# Patient Record
Sex: Female | Born: 1975 | Race: Black or African American | Hispanic: No | Marital: Single | Smoking: Never smoker
Health system: Southern US, Community
[De-identification: ages and names within clinical notes are randomized; demographics above are authoritative.]

## PROBLEM LIST (undated history)

## (undated) DIAGNOSIS — E669 Obesity, unspecified: Secondary | ICD-10-CM

## (undated) DIAGNOSIS — F329 Major depressive disorder, single episode, unspecified: Secondary | ICD-10-CM

## (undated) DIAGNOSIS — N200 Calculus of kidney: Secondary | ICD-10-CM

## (undated) DIAGNOSIS — F32A Depression, unspecified: Secondary | ICD-10-CM

## (undated) DIAGNOSIS — H409 Unspecified glaucoma: Secondary | ICD-10-CM

## (undated) DIAGNOSIS — Z87442 Personal history of urinary calculi: Secondary | ICD-10-CM

## (undated) DIAGNOSIS — R51 Headache: Secondary | ICD-10-CM

## (undated) DIAGNOSIS — R519 Headache, unspecified: Secondary | ICD-10-CM

## (undated) DIAGNOSIS — R1900 Intra-abdominal and pelvic swelling, mass and lump, unspecified site: Secondary | ICD-10-CM

## (undated) DIAGNOSIS — F419 Anxiety disorder, unspecified: Secondary | ICD-10-CM

## (undated) DIAGNOSIS — K30 Functional dyspepsia: Secondary | ICD-10-CM

## (undated) DIAGNOSIS — K8020 Calculus of gallbladder without cholecystitis without obstruction: Secondary | ICD-10-CM

## (undated) HISTORY — PX: FOOT SURGERY: SHX648

## (undated) HISTORY — DX: Depression, unspecified: F32.A

## (undated) HISTORY — DX: Major depressive disorder, single episode, unspecified: F32.9

## (undated) HISTORY — DX: Unspecified glaucoma: H40.9

## (undated) HISTORY — PX: GALLBLADDER SURGERY: SHX652

## (undated) HISTORY — DX: Intra-abdominal and pelvic swelling, mass and lump, unspecified site: R19.00

## (undated) HISTORY — DX: Obesity, unspecified: E66.9

## (undated) HISTORY — PX: WISDOM TOOTH EXTRACTION: SHX21

## (undated) HISTORY — PX: OTHER SURGICAL HISTORY: SHX169

## (undated) HISTORY — PX: TOOTH EXTRACTION: SUR596

## (undated) HISTORY — DX: Anxiety disorder, unspecified: F41.9

---
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
## 2001-02-12 ENCOUNTER — Other Ambulatory Visit: Admission: RE | Admit: 2001-02-12 | Discharge: 2001-02-12 | Payer: Self-pay | Admitting: Obstetrics and Gynecology

## 2001-03-17 ENCOUNTER — Encounter: Admission: RE | Admit: 2001-03-17 | Discharge: 2001-06-15 | Payer: Self-pay | Admitting: Obstetrics and Gynecology

## 2001-06-04 ENCOUNTER — Inpatient Hospital Stay (HOSPITAL_COMMUNITY): Admission: AD | Admit: 2001-06-04 | Discharge: 2001-06-04 | Payer: Self-pay | Admitting: Obstetrics and Gynecology

## 2001-08-27 ENCOUNTER — Inpatient Hospital Stay (HOSPITAL_COMMUNITY): Admission: AD | Admit: 2001-08-27 | Discharge: 2001-08-30 | Payer: Self-pay | Admitting: Ophthalmology

## 2001-09-28 ENCOUNTER — Other Ambulatory Visit: Admission: RE | Admit: 2001-09-28 | Discharge: 2001-09-28 | Payer: Self-pay | Admitting: Obstetrics and Gynecology

## 2002-11-03 ENCOUNTER — Other Ambulatory Visit: Admission: RE | Admit: 2002-11-03 | Discharge: 2002-11-03 | Payer: Self-pay | Admitting: Obstetrics & Gynecology

## 2003-02-27 ENCOUNTER — Encounter: Payer: Self-pay | Admitting: Obstetrics & Gynecology

## 2003-02-27 ENCOUNTER — Encounter: Admission: RE | Admit: 2003-02-27 | Discharge: 2003-02-27 | Payer: Self-pay | Admitting: Obstetrics & Gynecology

## 2003-11-10 ENCOUNTER — Other Ambulatory Visit: Admission: RE | Admit: 2003-11-10 | Discharge: 2003-11-10 | Payer: Self-pay | Admitting: Obstetrics & Gynecology

## 2004-06-07 ENCOUNTER — Encounter: Admission: RE | Admit: 2004-06-07 | Discharge: 2004-09-05 | Payer: Self-pay | Admitting: Family Medicine

## 2006-09-10 ENCOUNTER — Inpatient Hospital Stay (HOSPITAL_COMMUNITY): Admission: AD | Admit: 2006-09-10 | Discharge: 2006-09-14 | Payer: Self-pay | Admitting: Obstetrics and Gynecology

## 2007-04-21 ENCOUNTER — Ambulatory Visit: Payer: Self-pay | Admitting: Family Medicine

## 2007-04-21 DIAGNOSIS — R142 Eructation: Secondary | ICD-10-CM

## 2007-04-21 DIAGNOSIS — R143 Flatulence: Secondary | ICD-10-CM

## 2007-04-21 DIAGNOSIS — K3189 Other diseases of stomach and duodenum: Secondary | ICD-10-CM

## 2007-04-21 DIAGNOSIS — R141 Gas pain: Secondary | ICD-10-CM

## 2007-04-21 DIAGNOSIS — R1013 Epigastric pain: Secondary | ICD-10-CM

## 2007-04-22 ENCOUNTER — Telehealth (INDEPENDENT_AMBULATORY_CARE_PROVIDER_SITE_OTHER): Payer: Self-pay | Admitting: *Deleted

## 2007-04-22 LAB — CONVERTED CEMR LAB
ALT: 15 units/L (ref 0–35)
AST: 16 units/L (ref 0–37)
Albumin: 3.7 g/dL (ref 3.5–5.2)
Alkaline Phosphatase: 96 units/L (ref 39–117)
Bilirubin, Direct: 0.1 mg/dL (ref 0.0–0.3)
H Pylori IgG: NEGATIVE
Total Bilirubin: 0.7 mg/dL (ref 0.3–1.2)
Total Protein: 7.3 g/dL (ref 6.0–8.3)

## 2007-05-19 ENCOUNTER — Ambulatory Visit: Payer: Self-pay | Admitting: Family Medicine

## 2007-06-10 ENCOUNTER — Emergency Department (HOSPITAL_COMMUNITY): Admission: EM | Admit: 2007-06-10 | Discharge: 2007-06-10 | Payer: Self-pay | Admitting: Family Medicine

## 2010-11-15 NOTE — Discharge Summary (Signed)
NAME:  [name], [name]        ACCOUNT NO.:  [id number]   MEDICAL RECORD NO.:  [id number]          PATIENT TYPE:  INP   LOCATION:  9115                          FACILITY:  [facility]   PHYSICIAN:  Dineen Kid. Rana Snare, M.D.    DATE OF BIRTH:  09/29/1975   DATE OF ADMISSION:  09/10/2006  DATE OF DISCHARGE:  09/14/2006                               DISCHARGE SUMMARY   ADMITTING DIAGNOSES:  1. Intrauterine pregnancy at term.  2. Induction of labor secondary to decreased amniotic fluid.   DISCHARGE DIAGNOSES:  1. Status post low transverse cesarean section secondary to      nonreassuring fetal heart tones.  2. Viable female infant.   PROCEDURES:  Low transverse cesarean section.   REASON FOR ADMISSION:  Please see written H&P.   HOSPITAL COURSE:  The patient is a 35 year old African American married  female gravida 2, para 1 that was admitted to [facility] for an induction of labor.  The patient had been seen in the  office on the day prior to admission with noted decreased amniotic  fluid.  Estimated fetal weight was 8.5 pounds.  Blood pressure was noted  to be normal.  The patient was administered Cytotec.  The patient did  experience some mild to moderate contractions, and baby was noted to  have some repetitive decelerations.  The patient was administered oxygen  and terbutaline, and Cytotec was discontinued.  On the following morning  in view of the tracing which was noted to be at the time of rounding  reactive, given the history and decelerations in the low amniotic fluid,  decision was made to proceed with a primary low transverse cesarean  section.  The patient was then transferred to the operating room where  spinal anesthesia was administered without difficulty.  A low transverse  incision was made with delivery of a viable female infant weighing 8  pounds 14 ounces, Apgars of 8 at one minute and 9 at five minutes.  Arterial cord pH was 7.31.  The patient  tolerated the procedure well and  was taken to the recovery room in stable condition.  On postoperative  day #1, the patient was without complaint.  Vital signs were stable.  Abdomen soft with good return of bowel function.  Abdominal dressing was  noted to be clean, dry and intact.  Laboratory findings revealed a  hemoglobin of 10.4, platelet count of 202,000, WBC count of 12.0.  On  postoperative day #2, the patient was without complaint.  Vital signs  remained stable.  She is afebrile.  The fundus firm and nontender.  Abdominal dressing had been removed revealing an incision that was  clean, dry and intact.  She was ambulating well.  On postoperative day  #3, the patient was without complaint.  Vital signs remained stable.  Fundus firm and nontender.  Incision was clean, dry and intact.  Staples  were removed, and the patient was later discharged home.   CONDITION ON DISCHARGE:  Good.   DIET:  Regular as tolerated.   ACTIVITY:  No heavy lifting, no driving x2 weeks, no vaginal entry.   FOLLOW  UP:  Patient to follow up in the office in 1-2 weeks for incision  check.  She is to call for temperature greater than 100 degrees,  persistent nausea/vomiting, heavy vaginal bleeding and/or redness or  drainage from incisional site.   DISCHARGE MEDICATIONS:  1. Tylox #30 one p.o. every 4 to 6 hours p.r.n.  2. Motrin 600 mg every 6 hours.  3. Prenatal vitamins 1 p.o. daily.  4. Colace 1 p.o. daily p.r.n.      Julio Sicks, N.P.      Dineen Kid Rana Snare, M.D.  Electronically Signed    CC/MEDQ  D:  10/23/2006  T:  10/23/2006  Job:  [id number]

## 2010-11-15 NOTE — Discharge Summary (Signed)
[facility]  Patient:    [name], [name] Visit Number: [id number] MRN: [id number]          Service Type: Attending:  Rudy Jew. Ashley Royalty, M.D. Dictated by:   Rudy Jew Ashley Royalty, M.D. Adm. Date:  08/27/01 Disc. Date: 08/30/01                             Discharge Summary  DISCHARGE DIAGNOSES:          1. Intrauterine pregnancy at term.                               2. Spontaneous rupture of membranes.                               3. Vertex.  SPECIAL PROCEDURES:           OB delivery.  CONSULTATIONS:                None.  DISCHARGE MEDICATIONS:        Percocet 5/325 mg #21 p.o. q.3-4h. p.r.n. pain.  HISTORY AND PHYSICAL:         This is a 35 year old gravida 2, para 0, AB 1 at [redacted] weeks gestation.  The patient presented with spontaneous rupture of membranes on the morning of admission.  HOSPITAL COURSE:              The patient admitted to [facility].  Initial laboratory studies were done.  Followed expectantly.  On August 28, 2001 she was given Pitocin.  She developed a low-grade temperature prior to delivery of 100.8 degrees Fahrenheit.  Antibiotics were initiated. on August 28, 2001.  Postpartum course was benign.  The antibiotics were stopped and she was discharged home on August 30, 2001, afebrile and in satisfactory condition.  ACCESSORY MEDICAL FINDINGS:   Hematocrit on admission 11.1, hematocrit 33.3. Repeat values were obtained August 29, 2001 were 11.0 and 21.5 respectively. White blood count on admission was 10.4.  On August 29, 2001 it was 19.3.  RPR was nonreactive.  DISPOSITION:                  The patient is to return to [facility] in 4-6 weeks for postpartum examination.  She is asked to notify her physician should her temperature return to 100.4 degrees Fahrenheit or greater.  She states she understands and accepts and is comfortable.Dictated by:   Rudy Jew Ashley Royalty, M.D. Attending:  Rudy Jew Ashley Royalty,  M.D. DD:  09/11/01 TD:  09/13/01 Job: [id number]

## 2010-11-15 NOTE — H&P (Signed)
NAME:  [name], [name]        ACCOUNT NO.:  [id number]   MEDICAL RECORD NO.:  [id number]          PATIENT TYPE:  MAT   LOCATION:  MATC                          FACILITY:  [facility]   PHYSICIAN:  Duke Salvia. Marcelle Overlie, M.D.DATE OF BIRTH:  08-19-1975   DATE OF ADMISSION:  09/10/2006  DATE OF DISCHARGE:                              HISTORY & PHYSICAL   CHIEF COMPLAINT:  Labor induction at term with oligohydramnios.   HISTORY OF PRESENT ILLNESS:  A 35 year old G2, P1, EDD __________ , was  scheduled for induction, was brought in today at 39-6/7th's with a  normal blood pressure.  Ultrasound showed EFW 8 pounds 8 ounces with an  AFI of zero.  She is brought in for two-stage labor induction.  Her GBS  is negative.  One-hour GTT was 127.  Blood type is O positive.   PAST MEDICAL HISTORY:   OBSTETRICAL HISTORY:  A 6 pounds 0 ounce female delivered vaginally in  2003.   For the remainder of her medical history, please see her [facility] form.   PHYSICAL EXAMINATION:  VITAL SIGNS:  Temp 98.2, blood pressure 118/68.  HEENT:  Unremarkable.  NECK:  Supple without masses.  LUNGS:  Clear.  CARDIOVASCULAR:  Regular rate and rhythm without murmurs, rubs, or  gallops noted.  BREASTS:  Not examined.  ABDOMEN:  Term fundal height.  Fetal heart rate 140.  PELVIC:  Cervix was fingertip, long, vertex minus 3.  Fern Nitrazine  negative per Dr. Donnetta Hail exam.  EXTREMITIES:  Unremarkable.  NEUROLOGIC:  Unremarkable.   IMPRESSION:  1. Term intrauterine pregnancy.  2. Significant oligohydramnios, no evidence of SROM.   PLAN:  Admit for monitoring, two-stage labor induction.      Richard M. Marcelle Overlie, M.D.  Electronically Signed     RMH/MEDQ  D:  09/10/2006  T:  09/10/2006  Job:  [id number]

## 2013-05-09 ENCOUNTER — Other Ambulatory Visit: Payer: Self-pay | Admitting: Obstetrics and Gynecology

## 2014-01-16 ENCOUNTER — Ambulatory Visit: Payer: Self-pay | Admitting: Podiatry

## 2014-02-06 ENCOUNTER — Ambulatory Visit: Payer: Self-pay | Admitting: Podiatry

## 2014-04-14 ENCOUNTER — Encounter: Payer: Self-pay | Admitting: Physician Assistant

## 2014-04-14 ENCOUNTER — Ambulatory Visit (INDEPENDENT_AMBULATORY_CARE_PROVIDER_SITE_OTHER): Payer: BC Managed Care – PPO | Admitting: Physician Assistant

## 2014-04-14 VITALS — BP 109/75 | HR 81 | Temp 97.7°F | Resp 16 | Ht 67.0 in | Wt 219.5 lb

## 2014-04-14 DIAGNOSIS — R1084 Generalized abdominal pain: Secondary | ICD-10-CM

## 2014-04-14 DIAGNOSIS — K219 Gastro-esophageal reflux disease without esophagitis: Secondary | ICD-10-CM

## 2014-04-14 DIAGNOSIS — Z0000 Encounter for general adult medical examination without abnormal findings: Secondary | ICD-10-CM | POA: Insufficient documentation

## 2014-04-14 DIAGNOSIS — Z23 Encounter for immunization: Secondary | ICD-10-CM

## 2014-04-14 DIAGNOSIS — Z91018 Allergy to other foods: Secondary | ICD-10-CM

## 2014-04-14 DIAGNOSIS — Z1239 Encounter for other screening for malignant neoplasm of breast: Secondary | ICD-10-CM

## 2014-04-14 LAB — LIPID PANEL
CHOLESTEROL: 183 mg/dL (ref 0–200)
HDL: 38.6 mg/dL — AB (ref >39.00)
LDL CALC: 133 mg/dL — AB (ref 0–99)
NonHDL: 144.4
TRIGLYCERIDES: 57 mg/dL (ref 0.0–149.0)
Total CHOL/HDL Ratio: 5
VLDL: 11.4 mg/dL (ref 0.0–40.0)

## 2014-04-14 LAB — COMPREHENSIVE METABOLIC PANEL
ALT: 12 U/L (ref 0–35)
AST: 14 U/L (ref 0–37)
Albumin: 3.6 g/dL (ref 3.5–5.2)
Alkaline Phosphatase: 75 U/L (ref 39–117)
BILIRUBIN TOTAL: 0.6 mg/dL (ref 0.2–1.2)
BUN: 9 mg/dL (ref 6–23)
CALCIUM: 9 mg/dL (ref 8.4–10.5)
CHLORIDE: 106 meq/L (ref 96–112)
CO2: 23 mEq/L (ref 19–32)
Creatinine, Ser: 0.7 mg/dL (ref 0.4–1.2)
GFR: 117.22 mL/min (ref >60.00)
Glucose, Bld: 83 mg/dL (ref 70–99)
POTASSIUM: 3.9 meq/L (ref 3.5–5.1)
Sodium: 138 mEq/L (ref 135–145)
TOTAL PROTEIN: 7.5 g/dL (ref 6.0–8.3)

## 2014-04-14 LAB — CBC
HEMATOCRIT: 38.5 % (ref 36.0–46.0)
Hemoglobin: 12.6 g/dL (ref 12.0–15.0)
MCHC: 32.7 g/dL (ref 30.0–36.0)
MCV: 90 fl (ref 78.0–100.0)
PLATELETS: 304 10*3/uL (ref 150.0–400.0)
RBC: 4.28 Mil/uL (ref 3.87–5.11)
RDW: 13.2 % (ref 11.5–15.5)
WBC: 7.2 10*3/uL (ref 4.0–10.5)

## 2014-04-14 LAB — URINALYSIS, ROUTINE W REFLEX MICROSCOPIC
Bilirubin Urine: NEGATIVE
Hgb urine dipstick: NEGATIVE
Ketones, ur: NEGATIVE
LEUKOCYTES UA: NEGATIVE
NITRITE: NEGATIVE
PH: 6 (ref 5.0–8.0)
SPECIFIC GRAVITY, URINE: 1.025 (ref 1.000–1.030)
TOTAL PROTEIN, URINE-UPE24: NEGATIVE
Urine Glucose: NEGATIVE
Urobilinogen, UA: 0.2 (ref 0.0–1.0)

## 2014-04-14 LAB — TSH: TSH: 0.47 u[IU]/mL (ref 0.35–4.50)

## 2014-04-14 LAB — H. PYLORI ANTIBODY, IGG: H PYLORI IGG: NEGATIVE

## 2014-04-14 LAB — LIPASE: LIPASE: 21 U/L (ref 11.0–59.0)

## 2014-04-14 MED ORDER — ESOMEPRAZOLE MAGNESIUM 40 MG PO CPDR: 40.0000 mg | DELAYED_RELEASE_CAPSULE | Freq: Every day | ORAL | Status: DC

## 2014-04-14 NOTE — Assessment & Plan Note (Signed)
Medical history reviewed and updated.  TDaP given by nursing staff. Declines flu shot.  Will obtain fasting labs today.

## 2014-04-14 NOTE — Patient Instructions (Signed)
Please go to the lab for blood work.  I will call you with your results.  You will be contacted for an Ultrasound of your Abdomen to further assess your symptoms.  Take the Nexium prescription as directed.  Avoid use of Ibuprofen or Aleve.  Stay well hydrated. If all labs and imaging look good, we may need to set you up with Gastroenterology for further evaluation.  You will also be contacted for a mammogram. Please follow-up with your OB/GYN for your routine pelvic and breast examinations.  Follow-up with me in 1 month.   Preventive Care for Adults A healthy lifestyle and preventive care can promote health and wellness. Preventive health guidelines for women include the following key practices.  A routine yearly physical is a good way to check with your health care provider about your health and preventive screening. It is a chance to share any concerns and updates on your health and to receive a thorough exam.  Visit your dentist for a routine exam and preventive care every 6 months. Brush your teeth twice a day and floss once a day. Good oral hygiene prevents tooth decay and gum disease.  The frequency of eye exams is based on your age, health, family medical history, use of contact lenses, and other factors. Follow your health care provider's recommendations for frequency of eye exams.  Eat a healthy diet. Foods like vegetables, fruits, whole grains, low-fat dairy products, and lean protein foods contain the nutrients you need without too many calories. Decrease your intake of foods high in solid fats, added sugars, and salt. Eat the right amount of calories for you.Get information about a proper diet from your health care provider, if necessary.  Regular physical exercise is one of the most important things you can do for your health. Most adults should get at least 150 minutes of moderate-intensity exercise (any activity that increases your heart rate and causes you to sweat) each week. In  addition, most adults need muscle-strengthening exercises on 2 or more days a week.  Maintain a healthy weight. The body mass index (BMI) is a screening tool to identify possible weight problems. It provides an estimate of body fat based on height and weight. Your health care provider can find your BMI and can help you achieve or maintain a healthy weight.For adults 20 years and older:  A BMI below 18.5 is considered underweight.  A BMI of 18.5 to 24.9 is normal.  A BMI of 25 to 29.9 is considered overweight.  A BMI of 30 and above is considered obese.  Maintain normal blood lipids and cholesterol levels by exercising and minimizing your intake of saturated fat. Eat a balanced diet with plenty of fruit and vegetables. Blood tests for lipids and cholesterol should begin at age 79 and be repeated every 5 years. If your lipid or cholesterol levels are high, you are over 50, or you are at high risk for heart disease, you may need your cholesterol levels checked more frequently.Ongoing high lipid and cholesterol levels should be treated with medicines if diet and exercise are not working.  If you smoke, find out from your health care provider how to quit. If you do not use tobacco, do not start.  Lung cancer screening is recommended for adults aged 22-80 years who are at high risk for developing lung cancer because of a history of smoking. A yearly low-dose CT scan of the lungs is recommended for people who have at least a 30-pack-year history of  smoking and are a current smoker or have quit within the past 15 years. A pack year of smoking is smoking an average of 1 pack of cigarettes a day for 1 year (for example: 1 pack a day for 30 years or 2 packs a day for 15 years). Yearly screening should continue until the smoker has stopped smoking for at least 15 years. Yearly screening should be stopped for people who develop a health problem that would prevent them from having lung cancer treatment.  If  you are pregnant, do not drink alcohol. If you are breastfeeding, be very cautious about drinking alcohol. If you are not pregnant and choose to drink alcohol, do not have more than 1 drink per day. One drink is considered to be 12 ounces (355 mL) of beer, 5 ounces (148 mL) of wine, or 1.5 ounces (44 mL) of liquor.  Avoid use of street drugs. Do not share needles with anyone. Ask for help if you need support or instructions about stopping the use of drugs.  High blood pressure causes heart disease and increases the risk of stroke. Your blood pressure should be checked at least every 1 to 2 years. Ongoing high blood pressure should be treated with medicines if weight loss and exercise do not work.  If you are 28-9 years old, ask your health care provider if you should take aspirin to prevent strokes.  Diabetes screening involves taking a blood sample to check your fasting blood sugar level. This should be done once every 3 years, after age 18, if you are within normal weight and without risk factors for diabetes. Testing should be considered at a younger age or be carried out more frequently if you are overweight and have at least 1 risk factor for diabetes.  Breast cancer screening is essential preventive care for women. You should practice "breast self-awareness." This means understanding the normal appearance and feel of your breasts and may include breast self-examination. Any changes detected, no matter how small, should be reported to a health care provider. Women in their 62s and 30s should have a clinical breast exam (CBE) by a health care provider as part of a regular health exam every 1 to 3 years. After age 59, women should have a CBE every year. Starting at age 43, women should consider having a mammogram (breast X-ray test) every year. Women who have a family history of breast cancer should talk to their health care provider about genetic screening. Women at a high risk of breast cancer should  talk to their health care providers about having an MRI and a mammogram every year.  Breast cancer gene (BRCA)-related cancer risk assessment is recommended for women who have family members with BRCA-related cancers. BRCA-related cancers include breast, ovarian, tubal, and peritoneal cancers. Having family members with these cancers may be associated with an increased risk for harmful changes (mutations) in the breast cancer genes BRCA1 and BRCA2. Results of the assessment will determine the need for genetic counseling and BRCA1 and BRCA2 testing.  Routine pelvic exams to screen for cancer are no longer recommended for nonpregnant women who are considered low risk for cancer of the pelvic organs (ovaries, uterus, and vagina) and who do not have symptoms. Ask your health care provider if a screening pelvic exam is right for you.  If you have had past treatment for cervical cancer or a condition that could lead to cancer, you need Pap tests and screening for cancer for at least 20 years  after your treatment. If Pap tests have been discontinued, your risk factors (such as having a new sexual partner) need to be reassessed to determine if screening should be resumed. Some women have medical problems that increase the chance of getting cervical cancer. In these cases, your health care provider may recommend more frequent screening and Pap tests.  The HPV test is an additional test that may be used for cervical cancer screening. The HPV test looks for the virus that can cause the cell changes on the cervix. The cells collected during the Pap test can be tested for HPV. The HPV test could be used to screen women aged 38 years and older, and should be used in women of any age who have unclear Pap test results. After the age of 20, women should have HPV testing at the same frequency as a Pap test.  Colorectal cancer can be detected and often prevented. Most routine colorectal cancer screening begins at the age of  31 years and continues through age 48 years. However, your health care provider may recommend screening at an earlier age if you have risk factors for colon cancer. On a yearly basis, your health care provider may provide home test kits to check for hidden blood in the stool. Use of a small camera at the end of a tube, to directly examine the colon (sigmoidoscopy or colonoscopy), can detect the earliest forms of colorectal cancer. Talk to your health care provider about this at age 4, when routine screening begins. Direct exam of the colon should be repeated every 5-10 years through age 59 years, unless early forms of pre-cancerous polyps or small growths are found.  People who are at an increased risk for hepatitis B should be screened for this virus. You are considered at high risk for hepatitis B if:  You were born in a country where hepatitis B occurs often. Talk with your health care provider about which countries are considered high risk.  Your parents were born in a high-risk country and you have not received a shot to protect against hepatitis B (hepatitis B vaccine).  You have HIV or AIDS.  You use needles to inject street drugs.  You live with, or have sex with, someone who has hepatitis B.  You get hemodialysis treatment.  You take certain medicines for conditions like cancer, organ transplantation, and autoimmune conditions.  Hepatitis C blood testing is recommended for all people born from [birth year] through 1965 and any individual with known risks for hepatitis C.  Practice safe sex. Use condoms and avoid high-risk sexual practices to reduce the spread of sexually transmitted infections (STIs). STIs include gonorrhea, chlamydia, syphilis, trichomonas, herpes, HPV, and human immunodeficiency virus (HIV). Herpes, HIV, and HPV are viral illnesses that have no cure. They can result in disability, cancer, and death.  You should be screened for sexually transmitted illnesses (STIs)  including gonorrhea and chlamydia if:  You are sexually active and are younger than 24 years.  You are older than 24 years and your health care provider tells you that you are at risk for this type of infection.  Your sexual activity has changed since you were last screened and you are at an increased risk for chlamydia or gonorrhea. Ask your health care provider if you are at risk.  If you are at risk of being infected with HIV, it is recommended that you take a prescription medicine daily to prevent HIV infection. This is called preexposure prophylaxis (PrEP). You are  considered at risk if:  You are a heterosexual woman, are sexually active, and are at increased risk for HIV infection.  You take drugs by injection.  You are sexually active with a partner who has HIV.  Talk with your health care provider about whether you are at high risk of being infected with HIV. If you choose to begin PrEP, you should first be tested for HIV. You should then be tested every 3 months for as long as you are taking PrEP.  Osteoporosis is a disease in which the bones lose minerals and strength with aging. This can result in serious bone fractures or breaks. The risk of osteoporosis can be identified using a bone density scan. Women ages 44 years and over and women at risk for fractures or osteoporosis should discuss screening with their health care providers. Ask your health care provider whether you should take a calcium supplement or vitamin D to reduce the rate of osteoporosis.  Menopause can be associated with physical symptoms and risks. Hormone replacement therapy is available to decrease symptoms and risks. You should talk to your health care provider about whether hormone replacement therapy is right for you.  Use sunscreen. Apply sunscreen liberally and repeatedly throughout the day. You should seek shade when your shadow is shorter than you. Protect yourself by wearing long sleeves, pants, a  wide-brimmed hat, and sunglasses year round, whenever you are outdoors.  Once a month, do a whole body skin exam, using a mirror to look at the skin on your back. Tell your health care provider of new moles, moles that have irregular borders, moles that are larger than a pencil eraser, or moles that have changed in shape or color.  Stay current with required vaccines (immunizations).  Influenza vaccine. All adults should be immunized every year.  Tetanus, diphtheria, and acellular pertussis (Td, Tdap) vaccine. Pregnant women should receive 1 dose of Tdap vaccine during each pregnancy. The dose should be obtained regardless of the length of time since the last dose. Immunization is preferred during the 27th-36th week of gestation. An adult who has not previously received Tdap or who does not know her vaccine status should receive 1 dose of Tdap. This initial dose should be followed by tetanus and diphtheria toxoids (Td) booster doses every 10 years. Adults with an unknown or incomplete history of completing a 3-dose immunization series with Td-containing vaccines should begin or complete a primary immunization series including a Tdap dose. Adults should receive a Td booster every 10 years.  Varicella vaccine. An adult without evidence of immunity to varicella should receive 2 doses or a second dose if she has previously received 1 dose. Pregnant females who do not have evidence of immunity should receive the first dose after pregnancy. This first dose should be obtained before leaving the health care facility. The second dose should be obtained 4-8 weeks after the first dose.  Human papillomavirus (HPV) vaccine. Females aged 13-26 years who have not received the vaccine previously should obtain the 3-dose series. The vaccine is not recommended for use in pregnant females. However, pregnancy testing is not needed before receiving a dose. If a female is found to be pregnant after receiving a dose, no  treatment is needed. In that case, the remaining doses should be delayed until after the pregnancy. Immunization is recommended for any person with an immunocompromised condition through the age of 2 years if she did not get any or all doses earlier. During the 3-dose series, the  second dose should be obtained 4-8 weeks after the first dose. The third dose should be obtained 24 weeks after the first dose and 16 weeks after the second dose.  Zoster vaccine. One dose is recommended for adults aged 61 years or older unless certain conditions are present.  Measles, mumps, and rubella (MMR) vaccine. Adults born before [birth year] generally are considered immune to measles and mumps. Adults born in [birth year] or later should have 1 or more doses of MMR vaccine unless there is a contraindication to the vaccine or there is laboratory evidence of immunity to each of the three diseases. A routine second dose of MMR vaccine should be obtained at least 28 days after the first dose for students attending postsecondary schools, health care workers, or international travelers. People who received inactivated measles vaccine or an unknown type of measles vaccine during 1963-1967 should receive 2 doses of MMR vaccine. People who received inactivated mumps vaccine or an unknown type of mumps vaccine before 1979 and are at high risk for mumps infection should consider immunization with 2 doses of MMR vaccine. For females of childbearing age, rubella immunity should be determined. If there is no evidence of immunity, females who are not pregnant should be vaccinated. If there is no evidence of immunity, females who are pregnant should delay immunization until after pregnancy. Unvaccinated health care workers born before 60 who lack laboratory evidence of measles, mumps, or rubella immunity or laboratory confirmation of disease should consider measles and mumps immunization with 2 doses of MMR vaccine or rubella immunization with 1 dose of  MMR vaccine.  Pneumococcal 13-valent conjugate (PCV13) vaccine. When indicated, a person who is uncertain of her immunization history and has no record of immunization should receive the PCV13 vaccine. An adult aged 8 years or older who has certain medical conditions and has not been previously immunized should receive 1 dose of PCV13 vaccine. This PCV13 should be followed with a dose of pneumococcal polysaccharide (PPSV23) vaccine. The PPSV23 vaccine dose should be obtained at least 8 weeks after the dose of PCV13 vaccine. An adult aged 41 years or older who has certain medical conditions and previously received 1 or more doses of PPSV23 vaccine should receive 1 dose of PCV13. The PCV13 vaccine dose should be obtained 1 or more years after the last PPSV23 vaccine dose.  Pneumococcal polysaccharide (PPSV23) vaccine. When PCV13 is also indicated, PCV13 should be obtained first. All adults aged 96 years and older should be immunized. An adult younger than age 30 years who has certain medical conditions should be immunized. Any person who resides in a nursing home or long-term care facility should be immunized. An adult smoker should be immunized. People with an immunocompromised condition and certain other conditions should receive both PCV13 and PPSV23 vaccines. People with human immunodeficiency virus (HIV) infection should be immunized as soon as possible after diagnosis. Immunization during chemotherapy or radiation therapy should be avoided. Routine use of PPSV23 vaccine is not recommended for American Indians, [place]atives, or people younger than 65 years unless there are medical conditions that require PPSV23 vaccine. When indicated, people who have unknown immunization and have no record of immunization should receive PPSV23 vaccine. One-time revaccination 5 years after the first dose of PPSV23 is recommended for people aged 19-64 years who have chronic kidney failure, nephrotic syndrome, asplenia, or  immunocompromised conditions. People who received 1-2 doses of PPSV23 before age 12 years should receive another dose of PPSV23 vaccine at age 67 years or  later if at least 5 years have passed since the previous dose. Doses of PPSV23 are not needed for people immunized with PPSV23 at or after age 74 years.  Meningococcal vaccine. Adults with asplenia or persistent complement component deficiencies should receive 2 doses of quadrivalent meningococcal conjugate (MenACWY-D) vaccine. The doses should be obtained at least 2 months apart. Microbiologists working with certain meningococcal bacteria, [employer] recruits, people at risk during an outbreak, and people who travel to or live in countries with a high rate of meningitis should be immunized. A first-year college student up through age 68 years who is living in a residence hall should receive a dose if she did not receive a dose on or after her 16th birthday. Adults who have certain high-risk conditions should receive one or more doses of vaccine.  Hepatitis A vaccine. Adults who wish to be protected from this disease, have certain high-risk conditions, work with hepatitis A-infected animals, work in hepatitis A research labs, or travel to or work in countries with a high rate of hepatitis A should be immunized. Adults who were previously unvaccinated and who anticipate close contact with an international adoptee during the first 60 days after arrival in the [place] from a country with a high rate of hepatitis A should be immunized.  Hepatitis B vaccine. Adults who wish to be protected from this disease, have certain high-risk conditions, may be exposed to blood or other infectious body fluids, are household contacts or sex partners of hepatitis B positive people, are clients or workers in certain care facilities, or travel to or work in countries with a high rate of hepatitis B should be immunized.  Haemophilus influenzae type b (Hib) vaccine. A  previously unvaccinated person with asplenia or sickle cell disease or having a scheduled splenectomy should receive 1 dose of Hib vaccine. Regardless of previous immunization, a recipient of a hematopoietic stem cell transplant should receive a 3-dose series 6-12 months after her successful transplant. Hib vaccine is not recommended for adults with HIV infection. Preventive Services / Frequency Ages 5 to 92 years  Blood pressure check.** / Every 1 to 2 years.  Lipid and cholesterol check.** / Every 5 years beginning at age 67.  Clinical breast exam.** / Every 3 years for women in their 29s and 52s.  BRCA-related cancer risk assessment.** / For women who have family members with a BRCA-related cancer (breast, ovarian, tubal, or peritoneal cancers).  Pap test.** / Every 2 years from ages 37 through 46. Every 3 years starting at age 59 through age 34 or 62 with a history of 3 consecutive normal Pap tests.  HPV screening.** / Every 3 years from ages 57 through ages 44 to 37 with a history of 3 consecutive normal Pap tests.  Hepatitis C blood test.** / For any individual with known risks for hepatitis C.  Skin self-exam. / Monthly.  Influenza vaccine. / Every year.  Tetanus, diphtheria, and acellular pertussis (Tdap, Td) vaccine.** / Consult your health care provider. Pregnant women should receive 1 dose of Tdap vaccine during each pregnancy. 1 dose of Td every 10 years.  Varicella vaccine.** / Consult your health care provider. Pregnant females who do not have evidence of immunity should receive the first dose after pregnancy.  HPV vaccine. / 3 doses over 6 months, if 47 and younger. The vaccine is not recommended for use in pregnant females. However, pregnancy testing is not needed before receiving a dose.  Measles, mumps, rubella (MMR) vaccine.** / You  need at least 1 dose of MMR if you were born in 1957 or later. You may also need a 2nd dose. For females of childbearing age, rubella  immunity should be determined. If there is no evidence of immunity, females who are not pregnant should be vaccinated. If there is no evidence of immunity, females who are pregnant should delay immunization until after pregnancy.  Pneumococcal 13-valent conjugate (PCV13) vaccine.** / Consult your health care provider.  Pneumococcal polysaccharide (PPSV23) vaccine.** / 1 to 2 doses if you smoke cigarettes or if you have certain conditions.  Meningococcal vaccine.** / 1 dose if you are age 30 to 62 years and a Market researcher living in a residence hall, or have one of several medical conditions, you need to get vaccinated against meningococcal disease. You may also need additional booster doses.  Hepatitis A vaccine.** / Consult your health care provider.  Hepatitis B vaccine.** / Consult your health care provider.  Haemophilus influenzae type b (Hib) vaccine.** / Consult your health care provider. Ages 92 to 43 years  Blood pressure check.** / Every 1 to 2 years.  Lipid and cholesterol check.** / Every 5 years beginning at age 23 years.  Lung cancer screening. / Every year if you are aged 50-80 years and have a 30-pack-year history of smoking and currently smoke or have quit within the past 15 years. Yearly screening is stopped once you have quit smoking for at least 15 years or develop a health problem that would prevent you from having lung cancer treatment.  Clinical breast exam.** / Every year after age 7 years.  BRCA-related cancer risk assessment.** / For women who have family members with a BRCA-related cancer (breast, ovarian, tubal, or peritoneal cancers).  Mammogram.** / Every year beginning at age 35 years and continuing for as long as you are in good health. Consult with your health care provider.  Pap test.** / Every 3 years starting at age 59 years through age 70 or 27 years with a history of 3 consecutive normal Pap tests.  HPV screening.** / Every 3 years from  ages 72 years through ages 8 to 65 years with a history of 3 consecutive normal Pap tests.  Fecal occult blood test (FOBT) of stool. / Every year beginning at age 21 years and continuing until age 35 years. You may not need to do this test if you get a colonoscopy every 10 years.  Flexible sigmoidoscopy or colonoscopy.** / Every 5 years for a flexible sigmoidoscopy or every 10 years for a colonoscopy beginning at age 54 years and continuing until age 41 years.  Hepatitis C blood test.** / For all people born from [birth year] through 1967 and any individual with known risks for hepatitis C.  Skin self-exam. / Monthly.  Influenza vaccine. / Every year.  Tetanus, diphtheria, and acellular pertussis (Tdap/Td) vaccine.** / Consult your health care provider. Pregnant women should receive 1 dose of Tdap vaccine during each pregnancy. 1 dose of Td every 10 years.  Varicella vaccine.** / Consult your health care provider. Pregnant females who do not have evidence of immunity should receive the first dose after pregnancy.  Zoster vaccine.** / 1 dose for adults aged 17 years or older.  Measles, mumps, rubella (MMR) vaccine.** / You need at least 1 dose of MMR if you were born in 1957 or later. You may also need a 2nd dose. For females of childbearing age, rubella immunity should be determined. If there is no evidence of immunity, females  who are not pregnant should be vaccinated. If there is no evidence of immunity, females who are pregnant should delay immunization until after pregnancy.  Pneumococcal 13-valent conjugate (PCV13) vaccine.** / Consult your health care provider.  Pneumococcal polysaccharide (PPSV23) vaccine.** / 1 to 2 doses if you smoke cigarettes or if you have certain conditions.  Meningococcal vaccine.** / Consult your health care provider.  Hepatitis A vaccine.** / Consult your health care provider.  Hepatitis B vaccine.** / Consult your health care provider.  Haemophilus influenzae  type b (Hib) vaccine.** / Consult your health care provider. Ages 93 years and over  Blood pressure check.** / Every 1 to 2 years.  Lipid and cholesterol check.** / Every 5 years beginning at age 9 years.  Lung cancer screening. / Every year if you are aged 61-80 years and have a 30-pack-year history of smoking and currently smoke or have quit within the past 15 years. Yearly screening is stopped once you have quit smoking for at least 15 years or develop a health problem that would prevent you from having lung cancer treatment.  Clinical breast exam.** / Every year after age 71 years.  BRCA-related cancer risk assessment.** / For women who have family members with a BRCA-related cancer (breast, ovarian, tubal, or peritoneal cancers).  Mammogram.** / Every year beginning at age 63 years and continuing for as long as you are in good health. Consult with your health care provider.  Pap test.** / Every 3 years starting at age 51 years through age 72 or 74 years with 3 consecutive normal Pap tests. Testing can be stopped between 65 and 70 years with 3 consecutive normal Pap tests and no abnormal Pap or HPV tests in the past 10 years.  HPV screening.** / Every 3 years from ages 28 years through ages 51 or 20 years with a history of 3 consecutive normal Pap tests. Testing can be stopped between 65 and 70 years with 3 consecutive normal Pap tests and no abnormal Pap or HPV tests in the past 10 years.  Fecal occult blood test (FOBT) of stool. / Every year beginning at age 62 years and continuing until age 39 years. You may not need to do this test if you get a colonoscopy every 10 years.  Flexible sigmoidoscopy or colonoscopy.** / Every 5 years for a flexible sigmoidoscopy or every 10 years for a colonoscopy beginning at age 73 years and continuing until age 8 years.  Hepatitis C blood test.** / For all people born from [birth year] through 1967 and any individual with known risks for hepatitis  C.  Osteoporosis screening.** / A one-time screening for women ages 79 years and over and women at risk for fractures or osteoporosis.  Skin self-exam. / Monthly.  Influenza vaccine. / Every year.  Tetanus, diphtheria, and acellular pertussis (Tdap/Td) vaccine.** / 1 dose of Td every 10 years.  Varicella vaccine.** / Consult your health care provider.  Zoster vaccine.** / 1 dose for adults aged 90 years or older.  Pneumococcal 13-valent conjugate (PCV13) vaccine.** / Consult your health care provider.  Pneumococcal polysaccharide (PPSV23) vaccine.** / 1 dose for all adults aged 34 years and older.  Meningococcal vaccine.** / Consult your health care provider.  Hepatitis A vaccine.** / Consult your health care provider.  Hepatitis B vaccine.** / Consult your health care provider.  Haemophilus influenzae type b (Hib) vaccine.** / Consult your health care provider. ** Family history and personal history of risk and conditions may change your health care  provider's recommendations. Document Released: 08/12/2001 Document Revised: 10/31/2013 Document Reviewed: 11/11/2010 [facility] Patient Information 2015 [place], [place]. This information is not intended to replace advice given to you by your health care provider. Make sure you discuss any questions you have with your health care provider.

## 2014-04-14 NOTE — Assessment & Plan Note (Signed)
Unclear etiology, responsive previously to Nexium.  Will Rx Nexium daily for 2 week trial.  Will obtain CBC, CMP, lipase, h. Pylori and US abdomen.  Patient concerned about food allergy.  Will refer to Allergist. Follow-up in 1 month.

## 2014-04-14 NOTE — Progress Notes (Signed)
Patient presents to clinic today to establish care.  Acute Concerns: Patient c/o nausea intermittently over the past several months.  Not always occurring around meal time.  Some episodes of non-bloody emesis.  Denies chest pain, palpitations, lightheadedness or dizziness.  Endorses abdominal pain that is mostly diffuse but sometimes in epigastrium.  Notes relief with emesis.  Denies hx of liver dysfunction, gallbladder infection or gallstones. Denies NSAID use. Denies alcohol consumption.  Has attempted to take Probiotics to help with pain but states they made her sick.  Chronic Issues: Denies known significant PMH.  Health Maintenance: Dental -- up-to-date Vision -- up-to-date Immunizations -- Declines flu shot. Unsure of Last Tetanus. Will be getting TDaP today. Mammogram -- Never had mammogram.  Order for screening mammogram placed. PAP -- Is followed by OB/GYN.  Past Medical History  Diagnosis Date  . Depression   . Anxiety   . Glaucoma     Past Surgical History  Procedure Laterality Date  . Wisdom tooth extraction    . Tooth extraction    . Foot surgery      Bilateral  . Cesarean section    . Lasic      No current outpatient prescriptions on file prior to visit.   No current facility-administered medications on file prior to visit.    No Known Allergies  Family History  Problem Relation Age of Onset  . Healthy Mother     Living  . Healthy Father     Living  . Breast cancer Maternal Grandmother   . Stroke Paternal Grandmother   . Diabetes Maternal Uncle   . Kidney disease Maternal Uncle   . Hyperlipidemia Paternal Uncle   . Hypertension Paternal Uncle   . Healthy Brother   . Healthy Sister   . Healthy Daughter     x2    History   Social History  . Marital Status: Married    Spouse Name: N/A    Number of Children: N/A  . Years of Education: N/A   Occupational History  . Not on file.   Social History Main Topics  . Smoking status: Never  Smoker   . Smokeless tobacco: Never Used  . Alcohol Use: No  . Drug Use: No  . Sexual Activity: No   Other Topics Concern  . Not on file   Social History Narrative  . No narrative on file   Review of Systems  Constitutional: Negative for fever and weight loss.  HENT: Negative for ear discharge, ear pain, hearing loss and tinnitus.   Eyes: Negative for blurred vision, double vision, photophobia and pain.  Respiratory: Negative for cough and shortness of breath.   Cardiovascular: Negative for chest pain and palpitations.  Gastrointestinal: Positive for nausea, vomiting and abdominal pain. Negative for heartburn, diarrhea, constipation, blood in stool and melena.  Genitourinary: Negative for dysuria, urgency, frequency, hematuria and flank pain.  Neurological: Negative for dizziness, loss of consciousness and headaches.  Psychiatric/Behavioral: Negative for depression, suicidal ideas, hallucinations and substance abuse. The patient is not nervous/anxious and does not have insomnia.    BP 109/75  Pulse 81  Temp(Src) 97.7 F (36.5 C) (Oral)  Resp 16  Ht 5\' 7"  (1.702 m)  Wt 219 lb 8 oz (99.565 kg)  BMI 34.37 kg/m2  SpO2 100%  Physical Exam  Vitals reviewed. Constitutional: She is oriented to person, place, and time and well-developed, well-nourished, and in no distress.  HENT:  Head: Normocephalic and atraumatic.  Right Ear: External  ear normal.  Left Ear: External ear normal.  Nose: Nose normal.  Mouth/Throat: Oropharynx is clear and moist. No oropharyngeal exudate.  TM within normal limits bilaterally.  Eyes: Conjunctivae are normal. Pupils are equal, round, and reactive to light.  Neck: Neck supple. No thyromegaly present.  Cardiovascular: Normal rate, regular rhythm, normal heart sounds and intact distal pulses.   Pulmonary/Chest: Effort normal and breath sounds normal. No respiratory distress.  Abdominal: Soft. Bowel sounds are normal. She exhibits no distension and no  mass. There is no rebound and no guarding.  Mild diffuse tenderness without rebound or guarding.  Lymphadenopathy:    She has no cervical adenopathy.  Neurological: She is alert and oriented to person, place, and time.  Skin: Skin is warm and dry. No rash noted.  Psychiatric: Affect normal.    Assessment/Plan: Visit for preventive health examination Medical history reviewed and updated.  TDaP given by nursing staff. Declines flu shot.  Will obtain fasting labs today.  Generalized abdominal pain Unclear etiology, responsive previously to Nexium.  Will Rx Nexium daily for 2 week trial.  Will obtain CBC, CMP, lipase, h. Pylori and US abdomen.  Patient concerned about food allergy.  Will refer to Allergist. Follow-up in 1 month.

## 2014-04-14 NOTE — Progress Notes (Signed)
Pre visit review using our clinic review tool, if applicable. No additional management support is needed unless otherwise documented below in the visit note/SLS  

## 2014-04-14 NOTE — Addendum Note (Signed)
Addended by: Regis BillSCATES, SHARON L on: 04/14/2014 11:19 AM   Modules accepted: Orders

## 2014-04-17 ENCOUNTER — Other Ambulatory Visit (HOSPITAL_BASED_OUTPATIENT_CLINIC_OR_DEPARTMENT_OTHER): Payer: Self-pay

## 2014-04-18 ENCOUNTER — Ambulatory Visit (HOSPITAL_BASED_OUTPATIENT_CLINIC_OR_DEPARTMENT_OTHER)
Admission: RE | Admit: 2014-04-18 | Discharge: 2014-04-18 | Disposition: A | Discharge status: Home / Self Care | Payer: BC Managed Care – PPO | Source: Ambulatory Visit | Attending: Physician Assistant | Admitting: Physician Assistant

## 2014-04-18 DIAGNOSIS — K8020 Calculus of gallbladder without cholecystitis without obstruction: Secondary | ICD-10-CM | POA: Diagnosis not present

## 2014-04-18 DIAGNOSIS — R1084 Generalized abdominal pain: Secondary | ICD-10-CM | POA: Diagnosis present

## 2014-04-24 ENCOUNTER — Telehealth: Payer: Self-pay | Admitting: *Deleted

## 2014-04-24 DIAGNOSIS — R1011 Right upper quadrant pain: Secondary | ICD-10-CM

## 2014-04-24 NOTE — Telephone Encounter (Signed)
Referral placed.

## 2014-04-24 NOTE — Telephone Encounter (Signed)
Notified pt. She is agreeable to proceed with referral and has never seen GI before.

## 2014-04-24 NOTE — Telephone Encounter (Signed)
Message copied by Kathi SimpersFERGERSON, Oliva Montecalvo A on Mon Apr 24, 2014  3:58 PM ------      Message from: Marcelline MatesMARTIN, WILLIAM      Created: Tue Apr 18, 2014  4:54 PM       US reveals gallstones.  They do not seem to be obstructive of cause of pain at present, but that does not mean that some gallbladder spasms are not contributing to her symptoms. I recommend she let me send her to gastroenterology to further assess. ------

## 2014-05-03 ENCOUNTER — Encounter: Payer: Self-pay | Admitting: Physician Assistant

## 2014-05-04 ENCOUNTER — Ambulatory Visit: Payer: BC Managed Care – PPO

## 2014-05-18 ENCOUNTER — Ambulatory Visit
Admission: RE | Admit: 2014-05-18 | Discharge: 2014-05-18 | Disposition: A | Discharge status: Home / Self Care | Payer: BC Managed Care – PPO | Source: Ambulatory Visit | Attending: Physician Assistant | Admitting: Physician Assistant

## 2014-05-18 DIAGNOSIS — Z1239 Encounter for other screening for malignant neoplasm of breast: Secondary | ICD-10-CM

## 2014-05-19 ENCOUNTER — Ambulatory Visit: Payer: Self-pay | Admitting: Physician Assistant

## 2014-05-19 ENCOUNTER — Encounter: Payer: Self-pay | Admitting: Physician Assistant

## 2014-05-29 ENCOUNTER — Other Ambulatory Visit (INDEPENDENT_AMBULATORY_CARE_PROVIDER_SITE_OTHER): Payer: BC Managed Care – PPO

## 2014-05-29 ENCOUNTER — Ambulatory Visit (INDEPENDENT_AMBULATORY_CARE_PROVIDER_SITE_OTHER): Payer: BC Managed Care – PPO | Admitting: Physician Assistant

## 2014-05-29 ENCOUNTER — Encounter: Payer: Self-pay | Admitting: Physician Assistant

## 2014-05-29 VITALS — BP 124/70 | HR 76 | Ht 67.0 in | Wt 219.2 lb

## 2014-05-29 DIAGNOSIS — R110 Nausea: Secondary | ICD-10-CM

## 2014-05-29 DIAGNOSIS — K8020 Calculus of gallbladder without cholecystitis without obstruction: Secondary | ICD-10-CM

## 2014-05-29 DIAGNOSIS — K8050 Calculus of bile duct without cholangitis or cholecystitis without obstruction: Secondary | ICD-10-CM

## 2014-05-29 LAB — HEPATIC FUNCTION PANEL
ALT: 15 U/L (ref 0–35)
AST: 15 U/L (ref 0–37)
Albumin: 4.2 g/dL (ref 3.5–5.2)
Alkaline Phosphatase: 75 U/L (ref 39–117)
BILIRUBIN DIRECT: 0.1 mg/dL (ref 0.0–0.3)
Total Bilirubin: 0.7 mg/dL (ref 0.2–1.2)
Total Protein: 7.3 g/dL (ref 6.0–8.3)

## 2014-05-29 MED ORDER — ONDANSETRON HCL 4 MG PO TABS: 4.0000 mg | ORAL_TABLET | Freq: Four times a day (QID) | ORAL | Status: DC | PRN

## 2014-05-29 NOTE — Progress Notes (Signed)
Reviewed and agree with management. Vitaly Wanat D. Smokey Melott, M.D., FACG  

## 2014-05-29 NOTE — Progress Notes (Signed)
Patient ID: [name], female   DOB: [birth year], 38 y.o.   MRN: [id number]   Subjective:    Patient ID: [name], female    DOB: [birth year], 38 y.o.   MRN: [id number]  HPI Lamar LaundrySonya is a pleasant 38 year old African-American female new to GI today referred by primary care, Malva CoganMartin Cody PA-C for evaluation of right upper quadrant pain. Patient relates that she has had intermittent right upper quadrant pain over the past at least 1 year. She says the pain is intermittent and that she will generally have an episode once or twice per week. She describes the pain as intense nonradiating and lasting 30-45 minutes. She says she has to stop whatever she is doing when she has this pain and usually feels nauseated. In between these episodes she is not having any abdominal pain but has had some intermittent nausea. He has had a couple of episodes of vomiting with the pain. No fever or chills, no changes in her bowel habits. No complaints of heartburn or indigestion. She's not on any regular aspirin or NSAIDs. She was given a prescription for Nexium but says she hasn't taken that regularly and did not notice any difference when she did take it. Most recent labs done in October 2015 with normal CBC and hepatic panel. Upper abdominal ultrasound was done on 04/18/2014 and showed multiple gallstones no gallbladder wall thickening and a common bile duct of 2 mm.  Review of Systems Pertinent positive and negative review of systems were noted in the above HPI section.  All other review of systems was otherwise negative.  Outpatient Encounter Prescriptions as of 05/29/2014  Medication Sig  . ondansetron (ZOFRAN) 4 MG tablet Take 1 tablet (4 mg total) by mouth every 6 (six) hours as needed for nausea or vomiting.  . [DISCONTINUED] esomeprazole (NEXIUM) 40 MG capsule Take 1 capsule (40 mg total) by mouth daily.   No Known Allergies Patient Active Problem List   Diagnosis Date Noted  . Cholelithiasis  05/29/2014  . Visit for preventive health examination 04/14/2014  . Generalized abdominal pain 04/14/2014  . Breast cancer screening 04/14/2014  . DYSPEPSIA 04/21/2007  . GAS/BLOATING 04/21/2007   History   Social History  . Marital Status: Divorced    Spouse Name: N/A    Number of Children: N/A  . Years of Education: N/A   Occupational History  . Not on file.   Social History Main Topics  . Smoking status: Never Smoker   . Smokeless tobacco: Never Used  . Alcohol Use: No  . Drug Use: No  . Sexual Activity: No   Other Topics Concern  . Not on file   Social History Narrative    Ms. Zavala's family history includes Breast cancer in her maternal grandmother; Diabetes in her maternal uncle; Healthy in her brother, daughter, father, mother, and sister; Hyperlipidemia in her paternal uncle; Hypertension in her paternal uncle; Kidney disease in her maternal uncle; Stroke in her paternal grandmother.      Objective:    Filed Vitals:   05/29/14 0933  BP: 124/70  Pulse: 76    Physical Exam  well-developed African-American female in no acute distress, pleasant height 5 foot 7 weight 219. HEENT; nontraumatic normocephalic EOMI PERRLA sclera anicteric, Supple; no JVD, Cardiovascular; regular rate and rhythm with S1-S2 no murmur rub or gallop, Pulmonary; clear bilaterally, Abdomen; soft and basically nontender there is no palpable mass or hepatosplenomegaly bowel sounds are present, Rectal; exam not done, Ext; no clubbing cyanosis or  edema skin warm and dry, Psych; mood and affect appropriate     Assessment & Plan:   #341  38 year old female with 1 year history of intermittent right upper quadrant pain and associated nausea. Ultrasound confirms cholelithiasis and history is very consistent with intermittent biliary colic.  Plan; we will refer to general surgery for laparoscopic cholecystectomy Discussed low-fat diet Repeat hepatic panel today Zofran 4 mg every 6 hours  when necessary for nausea She will follow up with GI as needed, patient will be established with Dr. Arlyce DiceKaplan.   Ajee Heasley Oswald HillockS Nyellie Yetter PA-C 05/29/2014

## 2014-05-29 NOTE — Patient Instructions (Signed)
You have been scheduled for an appointment with Dr. Gaynelle AduEric Wilson at [facility]. Your appointment is on December 3rd at 9:45am. Please arrive at 9:15am for registration. Make certain to bring a list of current medications, including any over the counter medications or vitamins. Also bring your co-pay if you have one as well as your insurance cards. [facility] is located at [address], [address]. Should you need to reschedule your appointment, please contact them at [phone].  Your physician has requested that you go to the basement for the following lab work before leaving today: hepatic panel  We have sent the following medications to your pharmacy for you to pick up at your convenience: zofran  I appreciate the opportunity to care for you.

## 2014-07-26 ENCOUNTER — Other Ambulatory Visit: Payer: Self-pay | Admitting: Obstetrics and Gynecology

## 2014-07-27 LAB — CYTOLOGY - PAP

## 2015-10-08 ENCOUNTER — Encounter (HOSPITAL_COMMUNITY): Payer: Self-pay

## 2015-10-08 ENCOUNTER — Emergency Department (HOSPITAL_COMMUNITY)
Admission: EM | Admit: 2015-10-08 | Discharge: 2015-10-08 | Disposition: A | Discharge status: Home / Self Care | Payer: BC Managed Care – PPO | Attending: Emergency Medicine | Admitting: Emergency Medicine

## 2015-10-08 ENCOUNTER — Emergency Department (HOSPITAL_COMMUNITY): Payer: BC Managed Care – PPO

## 2015-10-08 DIAGNOSIS — R1011 Right upper quadrant pain: Secondary | ICD-10-CM | POA: Diagnosis present

## 2015-10-08 DIAGNOSIS — F329 Major depressive disorder, single episode, unspecified: Secondary | ICD-10-CM | POA: Insufficient documentation

## 2015-10-08 DIAGNOSIS — K8020 Calculus of gallbladder without cholecystitis without obstruction: Secondary | ICD-10-CM | POA: Insufficient documentation

## 2015-10-08 DIAGNOSIS — Z79899 Other long term (current) drug therapy: Secondary | ICD-10-CM | POA: Insufficient documentation

## 2015-10-08 DIAGNOSIS — F419 Anxiety disorder, unspecified: Secondary | ICD-10-CM | POA: Insufficient documentation

## 2015-10-08 DIAGNOSIS — Z8669 Personal history of other diseases of the nervous system and sense organs: Secondary | ICD-10-CM | POA: Insufficient documentation

## 2015-10-08 DIAGNOSIS — Z3202 Encounter for pregnancy test, result negative: Secondary | ICD-10-CM | POA: Insufficient documentation

## 2015-10-08 HISTORY — DX: Calculus of gallbladder without cholecystitis without obstruction: K80.20

## 2015-10-08 LAB — COMPREHENSIVE METABOLIC PANEL
ALBUMIN: 3.9 g/dL (ref 3.5–5.0)
ALK PHOS: 65 U/L (ref 38–126)
ALT: 18 U/L (ref 14–54)
AST: 16 U/L (ref 15–41)
Anion gap: 10 (ref 5–15)
BILIRUBIN TOTAL: 0.6 mg/dL (ref 0.3–1.2)
BUN: 11 mg/dL (ref 6–20)
CALCIUM: 9.1 mg/dL (ref 8.9–10.3)
CO2: 21 mmol/L — AB (ref 22–32)
CREATININE: 0.92 mg/dL (ref 0.44–1.00)
Chloride: 107 mmol/L (ref 101–111)
GFR calc Af Amer: >60 mL/min (ref >60)
GFR calc non Af Amer: >60 mL/min (ref >60)
GLUCOSE: 94 mg/dL (ref 65–99)
Potassium: 4.1 mmol/L (ref 3.5–5.1)
SODIUM: 138 mmol/L (ref 135–145)
TOTAL PROTEIN: 6.5 g/dL (ref 6.5–8.1)

## 2015-10-08 LAB — POC URINE PREG, ED: PREG TEST UR: NEGATIVE

## 2015-10-08 LAB — CBC
HCT: 38.2 % (ref 36.0–46.0)
HEMOGLOBIN: 12.7 g/dL (ref 12.0–15.0)
MCH: 29.4 pg (ref 26.0–34.0)
MCHC: 33.2 g/dL (ref 30.0–36.0)
MCV: 88.4 fL (ref 78.0–100.0)
PLATELETS: 272 10*3/uL (ref 150–400)
RBC: 4.32 MIL/uL (ref 3.87–5.11)
RDW: 12.5 % (ref 11.5–15.5)
WBC: 6.5 10*3/uL (ref 4.0–10.5)

## 2015-10-08 LAB — I-STAT BETA HCG BLOOD, ED (MC, WL, AP ONLY)

## 2015-10-08 LAB — LIPASE, BLOOD: Lipase: 22 U/L (ref 11–51)

## 2015-10-08 MED ORDER — ONDANSETRON HCL 4 MG/2ML IJ SOLN
4.0000 mg | Freq: Once | INTRAMUSCULAR | Status: DC
  Filled 2015-10-08: qty 2

## 2015-10-08 MED ORDER — MORPHINE SULFATE (PF) 4 MG/ML IV SOLN
4.0000 mg | Freq: Once | INTRAVENOUS | Status: DC
Start: 2015-10-08 — End: 2015-10-08
  Filled 2015-10-08: qty 1

## 2015-10-08 MED ORDER — SODIUM CHLORIDE 0.9 % IV SOLN
INTRAVENOUS | Status: DC
  Administered 2015-10-08: 06:00:00 via INTRAVENOUS

## 2015-10-08 MED ORDER — ONDANSETRON 4 MG PO TBDP: 4.0000 mg | ORAL_TABLET | Freq: Three times a day (TID) | ORAL | Status: DC | PRN

## 2015-10-08 MED ORDER — OXYCODONE-ACETAMINOPHEN 5-325 MG PO TABS: 1.0000 | ORAL_TABLET | Freq: Four times a day (QID) | ORAL | Status: DC | PRN

## 2015-10-08 NOTE — ED Notes (Signed)
Pt in ultrasound

## 2015-10-08 NOTE — ED Notes (Signed)
Pt states that she has had know gallbladder problems for over a year now and was supposed to have surgery to get it removed but didn't, pain is now unbearable. C/o nausea, vomiting x 1, pt is tearful

## 2015-10-08 NOTE — Discharge Instructions (Signed)
Cholelithiasis °Cholelithiasis (also called gallstones) is a form of gallbladder disease in which gallstones form in your gallbladder. The gallbladder is an organ that stores bile made in the liver, which helps digest fats. Gallstones begin as small crystals and slowly grow into stones. Gallstone pain occurs when the gallbladder spasms and a gallstone is blocking the duct. Pain can also occur when a stone passes out of the duct.  °RISK FACTORS °· Being female.   °· Having multiple pregnancies. Health care providers sometimes advise removing diseased gallbladders before future pregnancies.   °· Being obese. °· Eating a diet heavy in fried foods and fat.   °· Being older than 60 years and increasing age.   °· Prolonged use of medicines containing female hormones.   °· Having diabetes mellitus.   °· Rapidly losing weight.   °· Having a family history of gallstones (heredity).   °SYMPTOMS °· Nausea.   °· Vomiting. °· Abdominal pain.   °· Yellowing of the skin (jaundice).   °· Sudden pain. It may persist from several minutes to several hours. °· Fever.   °· Tenderness to the touch.  °In some cases, when gallstones do not move into the bile duct, people have no pain or symptoms. These are called "silent" gallstones.  °TREATMENT °Silent gallstones do not need treatment. In severe cases, emergency surgery may be required. Options for treatment include: °· Surgery to remove the gallbladder. This is the most common treatment. °· Medicines. These do not always work and may take 6-12 months or more to work. °· Shock wave treatment (extracorporeal biliary lithotripsy). In this treatment an ultrasound machine sends shock waves to the gallbladder to break gallstones into smaller pieces that can pass into the intestines or be dissolved by medicine. °HOME CARE INSTRUCTIONS  °· Only take over-the-counter or prescription medicines for pain, discomfort, or fever as directed by your health care provider.   °· Follow a low-fat diet until  seen again by your health care provider. Fat causes the gallbladder to contract, which can result in pain.   °· Follow up with your health care provider as directed. Attacks are almost always recurrent and surgery is usually required for permanent treatment.   °SEEK IMMEDIATE MEDICAL CARE IF:  °· Your pain increases and is not controlled by medicines.   °· You have a fever or persistent symptoms for more than 2-3 days.   °· You have a fever and your symptoms suddenly get worse.   °· You have persistent nausea and vomiting.   °MAKE SURE YOU:  °· Understand these instructions. °· Will watch your condition. °· Will get help right away if you are not doing well or get worse. °  °This information is not intended to replace advice given to you by your health care provider. Make sure you discuss any questions you have with your health care provider. °  °Document Released: 06/12/2005 Document Revised: 02/16/2013 Document Reviewed: 12/08/2012 °Elsevier Interactive Patient Education ©2016 [employer] °Low-Fat Diet for Pancreatitis or Gallbladder Conditions °A low-fat diet can be helpful if you have pancreatitis or a gallbladder condition. With these conditions, your pancreas and gallbladder have trouble digesting fats. A healthy eating plan with less fat will help rest your pancreas and gallbladder and reduce your symptoms. °WHAT DO I NEED TO KNOW ABOUT THIS DIET? °· Eat a low-fat diet. °¨ Reduce your fat intake to less than 20-30% of your total daily calories. This is less than 50-60 g of fat per day. °¨ Remember that you need some fat in your diet. Ask your dietician what your daily goal should be. °¨ Choose   nonfat and low-fat healthy foods. Look for the words "nonfat," "low fat," or "fat free." °¨ As a guide, look on the label and choose foods with less than 3 g of fat per serving. Eat only one serving. °· Avoid alcohol. °· Do not smoke. If you need help quitting, talk with your health care provider. °· Eat small  frequent meals instead of three large heavy meals. °WHAT FOODS CAN I EAT? °Grains °Include healthy grains and starches such as potatoes, wheat bread, fiber-rich cereal, and brown rice. Choose whole grain options whenever possible. In adults, whole grains should account for 45-65% of your daily calories.  °Fruits and Vegetables °Eat plenty of fruits and vegetables. Fresh fruits and vegetables add fiber to your diet. °Meats and Other Protein Sources °Eat lean meat such as chicken and pork. Trim any fat off of meat before cooking it. Eggs, fish, and beans are other sources of protein. In adults, these foods should account for 10-35% of your daily calories. °Dairy °Choose low-fat milk and dairy options. Dairy includes fat and protein, as well as calcium.  °Fats and Oils °Limit high-fat foods such as fried foods, sweets, baked goods, sugary drinks.  °Other °Creamy sauces and condiments, such as mayonnaise, can add extra fat. Think about whether or not you need to use them, or use smaller amounts or low fat options. °WHAT FOODS ARE NOT RECOMMENDED? °· High fat foods, such as: °¨ Baked goods. °¨ Ice cream. °¨ French toast. °¨ Sweet rolls. °¨ Pizza. °¨ Cheese bread. °¨ Foods covered with batter, butter, creamy sauces, or cheese. °¨ Fried foods. °¨ Sugary drinks and desserts. °· Foods that cause gas or bloating °  °This information is not intended to replace advice given to you by your health care provider. Make sure you discuss any questions you have with your health care provider. °  °Document Released: 06/21/2013 Document Reviewed: 06/21/2013 °Elsevier Interactive Patient Education ©2016 [employer] Inc[employer] ° °

## 2015-10-08 NOTE — ED Provider Notes (Signed)
Urine pregnancy is negative. Verified in mini lab. Did not cross over to the system  Suzanne BilisKevin Harrison Paulson, MD 10/08/15 [phone]

## 2015-10-08 NOTE — ED Provider Notes (Signed)
TIME SEEN: 5:30 AM  CHIEF COMPLAINT: Right upper quadrant pain, nausea and vomiting  HPI: Pt is a 40 y.o. female with history of depression, cholelithiasis who presents emergency department with right upper quadrant pain that started yesterday afternoon and has progressively worsened. Had one episode of vomiting. Reports her nausea has improved. No fevers but has had chills. No diarrhea, bloody stool or melena. No dysuria, hematuria, vaginal bleeding or discharge. Is status post C-section. No other abdominal surgeries. States pain feels similar to her prior gallstones. States she has seen a Careers adviser in the past but opted for medical management. She states she is maintaining a "clean diet" for the past several months. Her last abdominal ultrasound that we have on record was in 2015.  ROS: See HPI Constitutional: no fever  Eyes: no drainage  ENT: no runny nose   Cardiovascular:  no chest pain  Resp: no SOB  GI:  vomiting GU: no dysuria Integumentary: no rash  Allergy: no hives  Musculoskeletal: no leg swelling  Neurological: no slurred speech ROS otherwise negative  PAST MEDICAL HISTORY/PAST SURGICAL HISTORY:  Past Medical History  Diagnosis Date  . Depression   . Anxiety   . Glaucoma   . Gallstones     MEDICATIONS:  Prior to Admission medications   Medication Sig Start Date End Date Taking? Authorizing Provider  buPROPion (WELLBUTRIN XL) 300 MG 24 hr tablet Take 300 mg by mouth daily. 08/30/15  Yes Historical Provider, MD  citalopram (CELEXA) 10 MG tablet Take 10 mg by mouth daily. 08/30/15  Yes Historical Provider, MD  ondansetron (ZOFRAN) 4 MG tablet Take 1 tablet (4 mg total) by mouth every 6 (six) hours as needed for nausea or vomiting. Patient not taking: Reported on 10/08/2015 05/29/14   Amy S Esterwood, PA-C    ALLERGIES:  No Known Allergies  SOCIAL HISTORY:  Social History  Substance Use Topics  . Smoking status: Never Smoker   . Smokeless tobacco: Never Used  . Alcohol  Use: No    FAMILY HISTORY: Family History  Problem Relation Age of Onset  . Healthy Mother     Living  . Healthy Father     Living  . Breast cancer Maternal Grandmother   . Stroke Paternal Grandmother   . Diabetes Maternal Uncle   . Kidney disease Maternal Uncle   . Hyperlipidemia Paternal Uncle   . Hypertension Paternal Uncle   . Healthy Brother   . Healthy Sister   . Healthy Daughter     x2    EXAM: BP 149/93 mmHg  Pulse 78  Temp(Src) 98.2 F (36.8 C) (Oral)  Resp 18  Ht  (1.702 m)  Wt 196 lb (88.905 kg)  BMI 30.69 kg/m2  SpO2 100%  LMP 10/05/2015 CONSTITUTIONAL: Alert and oriented and responds appropriately to questions. Appears uncomfortable but is afebrile, nontoxic HEAD: Normocephalic EYES: Conjunctivae clear, PERRL ENT: normal nose; no rhinorrhea; moist mucous membranes NECK: Supple, no meningismus, no LAD  CARD: RRR; S1 and S2 appreciated; no murmurs, no clicks, no rubs, no gallops RESP: Normal chest excursion without splinting or tachypnea; breath sounds clear and equal bilaterally; no wheezes, no rhonchi, no rales, no hypoxia or respiratory distress, speaking full sentences ABD/GI: Normal bowel sounds; non-distended; soft, tender in the right upper quadrant with a negative Murphy sign, no rebound, no guarding, no peritoneal signs BACK:  The back appears normal and is non-tender to palpation, there is no CVA tenderness EXT: Normal ROM in all joints; non-tender to  palpation; no edema; normal capillary refill; no cyanosis, no calf tenderness or swelling    SKIN: Normal color for age and race; warm; no rash NEURO: Moves all extremities equally, sensation to light touch intact diffusely, cranial nerves II through XII intact PSYCH: The patient's mood and manner are appropriate. Grooming and personal hygiene are appropriate.  MEDICAL DECISION MAKING: Patient here with complaints of upper quadrant abdominal pain similar to her prior cholelithiasis, biliary colic.  Reports pain however is more intense than normal and does not resolve on its associated with nausea and vomiting. Also has some chills. Afebrile and the emergency department. Negative Murphy sign on exam. Will repeat an ultrasound to rule out choledocholithiasis, cholecystitis although my suspicion for this is less likely. We'll give IV fluids, pain and nausea medicine.  ED PROGRESS: 6:50 AM  Patient's labs are unremarkable. No leukocytosis. Normal LFTs, normal lipase.   8:15 AM  Patient's ultrasound shows cholelithiasis with minimal gallbladder wall thickening. There is no pericholecystic fluid and a negative Murphy sign. Currently I'm not clinically concern for cholecystitis. She has absolute no pain currently. States she is feeling much better. She did not have a Murphy sign with me prior to getting her ultrasound and has refused pain medication in the emergency department. Plan is to get a pregnancy test and if this is negative, discharge home with pain medication, nausea medicine and outpatient surgery follow-up information. Discussed return precautions with patient and family present.     At this time, I do not feel there is any life-threatening condition present. I have reviewed and discussed all results (EKG, imaging, lab, urine as appropriate), exam findings with patient. I have reviewed nursing notes and appropriate previous records.  I feel the patient is safe to be discharged home without further emergent workup. Discussed usual and customary return precautions. Patient and family (if present) verbalize understanding and are comfortable with this plan.  Patient will follow-up with their primary care provider. If they do not have a primary care provider, information for follow-up has been provided to them. All questions have been answered.   Layla MawKristen N Ward, DO 10/08/15 [phone]

## 2015-11-06 ENCOUNTER — Ambulatory Visit: Payer: Self-pay | Admitting: General Surgery

## 2015-11-07 NOTE — Pre-Procedure Instructions (Signed)
[name]  11/07/2015      [facility] [id number] [place]- [place], [place] - [address] [facility] [address] [place] [id number] Phone: [phone] Fax: [phone]    Your procedure is scheduled on Thursday, May 18th, 2017  [facility] at 6:30 A.M.   Call this number if you have problems the morning of surgery:  [phone]   Remember:  Do not eat food or drink liquids after midnight.  On Wednesday    Take these medicines the morning of surgery with A SIP OF WATER: Bupropion (Wellbutrin), Citalopram (Celexa).  7 days prior to surgery, stop taking: Meloxicam (Mobic), Aspirin, NSAIDS, Aleve, Naproxen, Ibuprofen, Advil, Motrin, BC's, Goody's, Fish oil, all herbal medications, and all vitamins.    Do not wear jewelry, make-up or nail polish.             DO NOT WEAR  Any cologne, perfume, lotions, powders after you shower   Do not shave 48 hours prior to surgery.    Do not bring valuables to the hospital.   [facility] is not responsible for any belongings or valuables.  Contacts, dentures or bridgework may not be worn into surgery.  Leave your suitcase in the car.  After surgery it may be brought to your room.  For patients admitted to the hospital, discharge time will be determined by your treatment team.  Patients discharged the day of surgery will not be allowed to drive home.     Special instructions: Special Instructions: [facility] - Preparing for Surgery  Before surgery, you can play an important role.  Because skin is not sterile, your skin needs to be as free of germs as possible.  You can reduce the number of germs on you skin by washing with CHG (chlorahexidine gluconate) soap before surgery.  CHG is an antiseptic cleaner which kills germs and bonds with the skin to continue killing germs even after washing.  Please DO NOT use if you have an allergy to CHG or  antibacterial soaps.  If your skin becomes reddened/irritated stop using the CHG and inform your nurse when you arrive at Short Stay.  Do not shave (including legs and underarms) for at least 48 hours prior to the first CHG shower.  You may shave your face.  Please follow these instructions carefully:   1.  Shower with CHG Soap the night before surgery and the  morning of Surgery.  2.  If you choose to wash your hair, wash your hair first as usual with your  normal shampoo.  3.  After you shampoo, rinse your hair and body thoroughly to remove the  Shampoo.  4.  Use CHG as you would any other liquid soap.  You can apply chg directly to the skin and wash gently with scrungie or a clean washcloth.  5.  Apply the CHG Soap to your body ONLY FROM THE NECK DOWN.    Do not use on open wounds or open sores.  Avoid contact with your eyes, ears, mouth and genitals (private parts).  Wash genitals (private parts)   with your normal soap.  6.  Wash thoroughly, paying special attention to the area where your surgery will be performed.  7.  Thoroughly rinse your body with warm water from the neck down.  8.  DO NOT shower/wash with your normal soap after using and rinsing off   the  CHG Soap.  9.  Pat yourself dry with a clean towel.            10.  Wear clean pajamas.            11.  Place clean sheets on your bed the night of your first shower and do not sleep with pets.  Day of Surgery  Do not apply any lotions/deodorants the morning of surgery.  Please wear clean clothes to the hospital/surgery center.  Please read over the following fact sheets that you were given. Pain Booklet, Coughing and Deep Breathing and Surgical Site Infection Prevention

## 2015-11-08 ENCOUNTER — Encounter (HOSPITAL_COMMUNITY): Payer: Self-pay

## 2015-11-08 ENCOUNTER — Encounter (HOSPITAL_COMMUNITY)
Admission: RE | Admit: 2015-11-08 | Discharge: 2015-11-08 | Disposition: A | Discharge status: Home / Self Care | Payer: BC Managed Care – PPO | Source: Ambulatory Visit | Attending: General Surgery | Admitting: General Surgery

## 2015-11-08 DIAGNOSIS — Z01812 Encounter for preprocedural laboratory examination: Secondary | ICD-10-CM | POA: Insufficient documentation

## 2015-11-08 DIAGNOSIS — K8020 Calculus of gallbladder without cholecystitis without obstruction: Secondary | ICD-10-CM | POA: Diagnosis not present

## 2015-11-08 HISTORY — DX: Headache, unspecified: R51.9

## 2015-11-08 HISTORY — DX: Headache: R51

## 2015-11-08 HISTORY — DX: Functional dyspepsia: K30

## 2015-11-08 LAB — CBC
HCT: 35.9 % — ABNORMAL LOW (ref 36.0–46.0)
HEMOGLOBIN: 11.9 g/dL — AB (ref 12.0–15.0)
MCH: 30 pg (ref 26.0–34.0)
MCHC: 33.1 g/dL (ref 30.0–36.0)
MCV: 90.4 fL (ref 78.0–100.0)
PLATELETS: 260 10*3/uL (ref 150–400)
RBC: 3.97 MIL/uL (ref 3.87–5.11)
RDW: 12.7 % (ref 11.5–15.5)
WBC: 6 10*3/uL (ref 4.0–10.5)

## 2015-11-08 LAB — HCG, SERUM, QUALITATIVE: PREG SERUM: NEGATIVE

## 2015-11-08 NOTE — Progress Notes (Signed)
PCP - Dr. Marcelline MatesWilliam Martin Cardiologist - denies  EKG/CXR - denies Echo/stress test/cardiac cath - denies  Patient denies chest pain and shortness of breath at PAT appointment.

## 2015-11-14 MED ORDER — CEFOTETAN DISODIUM-DEXTROSE 2-2.08 GM-% IV SOLR
2.0000 g | INTRAVENOUS | Status: AC
Start: 2015-11-15 — End: 2015-11-15
  Administered 2015-11-15: 2 g via INTRAVENOUS
  Filled 2015-11-14: qty 50

## 2015-11-15 ENCOUNTER — Encounter (HOSPITAL_COMMUNITY)
Admission: RE | Disposition: A | Discharge status: Home / Self Care | Payer: Self-pay | Source: Ambulatory Visit | Attending: General Surgery

## 2015-11-15 ENCOUNTER — Ambulatory Visit (HOSPITAL_COMMUNITY)
Admission: RE | Admit: 2015-11-15 | Discharge: 2015-11-15 | Disposition: A | Discharge status: Home / Self Care | Payer: BC Managed Care – PPO | Source: Ambulatory Visit | Attending: General Surgery | Admitting: General Surgery

## 2015-11-15 ENCOUNTER — Encounter (HOSPITAL_COMMUNITY): Payer: Self-pay | Admitting: Certified Registered Nurse Anesthetist

## 2015-11-15 ENCOUNTER — Ambulatory Visit (HOSPITAL_COMMUNITY): Payer: BC Managed Care – PPO | Admitting: Certified Registered Nurse Anesthetist

## 2015-11-15 ENCOUNTER — Ambulatory Visit (HOSPITAL_COMMUNITY): Payer: BC Managed Care – PPO

## 2015-11-15 DIAGNOSIS — K8010 Calculus of gallbladder with chronic cholecystitis without obstruction: Secondary | ICD-10-CM | POA: Diagnosis present

## 2015-11-15 DIAGNOSIS — Z6831 Body mass index (BMI) 31.0-31.9, adult: Secondary | ICD-10-CM | POA: Diagnosis not present

## 2015-11-15 DIAGNOSIS — E669 Obesity, unspecified: Secondary | ICD-10-CM | POA: Insufficient documentation

## 2015-11-15 DIAGNOSIS — Z419 Encounter for procedure for purposes other than remedying health state, unspecified: Secondary | ICD-10-CM

## 2015-11-15 DIAGNOSIS — F329 Major depressive disorder, single episode, unspecified: Secondary | ICD-10-CM | POA: Insufficient documentation

## 2015-11-15 HISTORY — PX: CHOLECYSTECTOMY: SHX55

## 2015-11-15 SURGERY — LAPAROSCOPIC CHOLECYSTECTOMY WITH INTRAOPERATIVE CHOLANGIOGRAM
Anesthesia: General | Site: Abdomen

## 2015-11-15 MED ORDER — PROPOFOL 10 MG/ML IV BOLUS
INTRAVENOUS | Status: DC | PRN
  Administered 2015-11-15: 180 mg via INTRAVENOUS
  Administered 2015-11-15: 30 mg via INTRAVENOUS

## 2015-11-15 MED ORDER — ONDANSETRON HCL 4 MG/2ML IJ SOLN
4.0000 mg | Freq: Once | INTRAMUSCULAR | Status: AC | PRN
  Administered 2015-11-15: 4 mg via INTRAVENOUS

## 2015-11-15 MED ORDER — EPHEDRINE SULFATE-NACL 50-0.9 MG/10ML-% IV SOSY
PREFILLED_SYRINGE | INTRAVENOUS | Status: DC | PRN
  Administered 2015-11-15: 15 mg via INTRAVENOUS

## 2015-11-15 MED ORDER — PHENYLEPHRINE 40 MCG/ML (10ML) SYRINGE FOR IV PUSH (FOR BLOOD PRESSURE SUPPORT)
PREFILLED_SYRINGE | INTRAVENOUS | Status: DC | PRN
  Administered 2015-11-15: 80 ug via INTRAVENOUS

## 2015-11-15 MED ORDER — ONDANSETRON HCL 4 MG/2ML IJ SOLN
INTRAMUSCULAR | Status: AC
  Filled 2015-11-15: qty 2

## 2015-11-15 MED ORDER — LACTATED RINGERS IV SOLN
INTRAVENOUS | Status: DC
  Administered 2015-11-15 (×2): via INTRAVENOUS

## 2015-11-15 MED ORDER — OXYCODONE HCL 5 MG/5ML PO SOLN: 5.0000 mg | Freq: Once | ORAL | Status: DC | PRN

## 2015-11-15 MED ORDER — BUPIVACAINE-EPINEPHRINE 0.25% -1:200000 IJ SOLN
INTRAMUSCULAR | Status: DC | PRN
  Administered 2015-11-15: 30 mL

## 2015-11-15 MED ORDER — PROPOFOL 10 MG/ML IV BOLUS
INTRAVENOUS | Status: AC
  Filled 2015-11-15: qty 40

## 2015-11-15 MED ORDER — OXYCODONE HCL 5 MG PO TABS: 5.0000 mg | ORAL_TABLET | ORAL | Status: DC | PRN

## 2015-11-15 MED ORDER — SODIUM CHLORIDE 0.9 % IR SOLN
Status: DC | PRN
Start: 2015-11-15 — End: 2015-11-15
  Administered 2015-11-15: 1000 mL

## 2015-11-15 MED ORDER — OXYCODONE HCL 5 MG PO TABS
5.0000 mg | ORAL_TABLET | ORAL | Status: DC | PRN
  Administered 2015-11-15: 10 mg via ORAL

## 2015-11-15 MED ORDER — ONDANSETRON HCL 4 MG/2ML IJ SOLN
INTRAMUSCULAR | Status: DC | PRN
  Administered 2015-11-15: 4 mg via INTRAVENOUS

## 2015-11-15 MED ORDER — MIDAZOLAM HCL 5 MG/5ML IJ SOLN
INTRAMUSCULAR | Status: DC | PRN
  Administered 2015-11-15: 2 mg via INTRAVENOUS

## 2015-11-15 MED ORDER — PHENYLEPHRINE 40 MCG/ML (10ML) SYRINGE FOR IV PUSH (FOR BLOOD PRESSURE SUPPORT)
PREFILLED_SYRINGE | INTRAVENOUS | Status: AC
  Filled 2015-11-15: qty 10

## 2015-11-15 MED ORDER — OXYCODONE HCL 5 MG PO TABS: 5.0000 mg | ORAL_TABLET | Freq: Once | ORAL | Status: DC | PRN

## 2015-11-15 MED ORDER — EPHEDRINE 5 MG/ML INJ
INTRAVENOUS | Status: AC
  Filled 2015-11-15: qty 10

## 2015-11-15 MED ORDER — FENTANYL CITRATE (PF) 100 MCG/2ML IJ SOLN
INTRAMUSCULAR | Status: DC | PRN
  Administered 2015-11-15: 50 ug via INTRAVENOUS
  Administered 2015-11-15: 100 ug via INTRAVENOUS
  Administered 2015-11-15: 50 ug via INTRAVENOUS

## 2015-11-15 MED ORDER — NEOSTIGMINE METHYLSULFATE 5 MG/5ML IV SOSY
PREFILLED_SYRINGE | INTRAVENOUS | Status: AC
  Filled 2015-11-15: qty 5

## 2015-11-15 MED ORDER — FENTANYL CITRATE (PF) 100 MCG/2ML IJ SOLN
25.0000 ug | INTRAMUSCULAR | Status: DC | PRN
  Administered 2015-11-15: 50 ug via INTRAVENOUS

## 2015-11-15 MED ORDER — LIDOCAINE 2% (20 MG/ML) 5 ML SYRINGE
INTRAMUSCULAR | Status: AC
  Filled 2015-11-15: qty 5

## 2015-11-15 MED ORDER — KETOROLAC TROMETHAMINE 30 MG/ML IJ SOLN
INTRAMUSCULAR | Status: DC | PRN
  Administered 2015-11-15: 30 mg via INTRAVENOUS

## 2015-11-15 MED ORDER — SUGAMMADEX SODIUM 200 MG/2ML IV SOLN
INTRAVENOUS | Status: AC
  Filled 2015-11-15: qty 2

## 2015-11-15 MED ORDER — DEXAMETHASONE SODIUM PHOSPHATE 10 MG/ML IJ SOLN
INTRAMUSCULAR | Status: AC
  Filled 2015-11-15: qty 1

## 2015-11-15 MED ORDER — SODIUM CHLORIDE 0.9 % IV SOLN
INTRAVENOUS | Status: DC | PRN
  Administered 2015-11-15: 9 mL

## 2015-11-15 MED ORDER — MIDAZOLAM HCL 2 MG/2ML IJ SOLN
INTRAMUSCULAR | Status: AC
  Filled 2015-11-15: qty 2

## 2015-11-15 MED ORDER — ROCURONIUM BROMIDE 50 MG/5ML IV SOLN
INTRAVENOUS | Status: AC
  Filled 2015-11-15: qty 1

## 2015-11-15 MED ORDER — FENTANYL CITRATE (PF) 250 MCG/5ML IJ SOLN
INTRAMUSCULAR | Status: AC
  Filled 2015-11-15: qty 5

## 2015-11-15 MED ORDER — GLYCOPYRROLATE 0.2 MG/ML IV SOSY
PREFILLED_SYRINGE | INTRAVENOUS | Status: AC
  Filled 2015-11-15: qty 3

## 2015-11-15 MED ORDER — LIDOCAINE 2% (20 MG/ML) 5 ML SYRINGE
INTRAMUSCULAR | Status: DC | PRN
  Administered 2015-11-15: 60 mg via INTRAVENOUS

## 2015-11-15 MED ORDER — FENTANYL CITRATE (PF) 100 MCG/2ML IJ SOLN
INTRAMUSCULAR | Status: AC
  Filled 2015-11-15: qty 2

## 2015-11-15 MED ORDER — KETOROLAC TROMETHAMINE 30 MG/ML IJ SOLN
INTRAMUSCULAR | Status: AC
  Filled 2015-11-15: qty 1

## 2015-11-15 MED ORDER — BUPIVACAINE-EPINEPHRINE (PF) 0.25% -1:200000 IJ SOLN
INTRAMUSCULAR | Status: AC
  Filled 2015-11-15: qty 30

## 2015-11-15 MED ORDER — NEOSTIGMINE METHYLSULFATE 5 MG/5ML IV SOSY
PREFILLED_SYRINGE | INTRAVENOUS | Status: DC | PRN
  Administered 2015-11-15: 4 mg via INTRAVENOUS

## 2015-11-15 MED ORDER — GLYCOPYRROLATE 0.2 MG/ML IV SOSY
PREFILLED_SYRINGE | INTRAVENOUS | Status: DC | PRN
  Administered 2015-11-15: 0.6 mg via INTRAVENOUS

## 2015-11-15 MED ORDER — DEXAMETHASONE SODIUM PHOSPHATE 10 MG/ML IJ SOLN
INTRAMUSCULAR | Status: DC | PRN
  Administered 2015-11-15: 5 mg via INTRAVENOUS

## 2015-11-15 MED ORDER — IOPAMIDOL (ISOVUE-300) INJECTION 61%
INTRAVENOUS | Status: AC
  Filled 2015-11-15: qty 50

## 2015-11-15 MED ORDER — 0.9 % SODIUM CHLORIDE (POUR BTL) OPTIME
TOPICAL | Status: DC | PRN
  Administered 2015-11-15: 1000 mL

## 2015-11-15 MED ORDER — ROCURONIUM BROMIDE 10 MG/ML (PF) SYRINGE
PREFILLED_SYRINGE | INTRAVENOUS | Status: DC | PRN
  Administered 2015-11-15: 10 mg via INTRAVENOUS
  Administered 2015-11-15: 50 mg via INTRAVENOUS
  Administered 2015-11-15 (×2): 10 mg via INTRAVENOUS

## 2015-11-15 MED ORDER — OXYCODONE HCL 5 MG PO TABS
ORAL_TABLET | ORAL | Status: AC
  Filled 2015-11-15: qty 2

## 2015-11-15 SURGICAL SUPPLY — 53 items
APL SKNCLS STERI-STRIP NONHPOA (GAUZE/BANDAGES/DRESSINGS) ×1
APPLIER CLIP 5 13 M/L LIGAMAX5 (MISCELLANEOUS) ×3
APR CLP MED LRG 5 ANG JAW (MISCELLANEOUS) ×1
BAG SPEC RTRVL 10 TROC 200 (ENDOMECHANICALS) ×1
BANDAGE ADH SHEER 1  50/CT (GAUZE/BANDAGES/DRESSINGS) ×9 IMPLANT
BENZOIN TINCTURE PRP APPL 2/3 (GAUZE/BANDAGES/DRESSINGS) ×3 IMPLANT
BLADE SURG ROTATE 9660 (MISCELLANEOUS) IMPLANT
CANISTER SUCTION 2500CC (MISCELLANEOUS) ×3 IMPLANT
CHLORAPREP W/TINT 26ML (MISCELLANEOUS) ×3 IMPLANT
CLIP APPLIE 5 13 M/L LIGAMAX5 (MISCELLANEOUS) ×1 IMPLANT
CLOSURE WOUND 1/2 X4 (GAUZE/BANDAGES/DRESSINGS) ×1
COVER MAYO STAND STRL (DRAPES) ×3 IMPLANT
COVER SURGICAL LIGHT HANDLE (MISCELLANEOUS) ×3 IMPLANT
DRAPE C-ARM 42X72 X-RAY (DRAPES) ×3 IMPLANT
DRSG TEGADERM 4X4.75 (GAUZE/BANDAGES/DRESSINGS) ×3 IMPLANT
ELECT REM PT RETURN 9FT ADLT (ELECTROSURGICAL) ×3
ELECTRODE REM PT RTRN 9FT ADLT (ELECTROSURGICAL) ×1 IMPLANT
GAUZE SPONGE 2X2 8PLY STRL LF (GAUZE/BANDAGES/DRESSINGS) ×1 IMPLANT
GLOVE BIOGEL M STRL SZ7.5 (GLOVE) ×3 IMPLANT
GLOVE BIOGEL PI IND STRL 7.0 (GLOVE) IMPLANT
GLOVE BIOGEL PI IND STRL 7.5 (GLOVE) IMPLANT
GLOVE BIOGEL PI IND STRL 8 (GLOVE) ×1 IMPLANT
GLOVE BIOGEL PI IND STRL 8.5 (GLOVE) IMPLANT
GLOVE BIOGEL PI INDICATOR 7.0 (GLOVE) ×2
GLOVE BIOGEL PI INDICATOR 7.5 (GLOVE) ×2
GLOVE BIOGEL PI INDICATOR 8 (GLOVE) ×2
GLOVE BIOGEL PI INDICATOR 8.5 (GLOVE) ×2
GLOVE SURG SS PI 7.0 STRL IVOR (GLOVE) ×2 IMPLANT
GLOVE SURG SS PI 8.0 STRL IVOR (GLOVE) ×2 IMPLANT
GOWN STRL REUS W/ TWL LRG LVL3 (GOWN DISPOSABLE) ×3 IMPLANT
GOWN STRL REUS W/ TWL XL LVL3 (GOWN DISPOSABLE) ×1 IMPLANT
GOWN STRL REUS W/TWL LRG LVL3 (GOWN DISPOSABLE) ×9
GOWN STRL REUS W/TWL XL LVL3 (GOWN DISPOSABLE) ×3
KIT BASIN OR (CUSTOM PROCEDURE TRAY) ×3 IMPLANT
KIT ROOM TURNOVER OR (KITS) ×3 IMPLANT
NS IRRIG 1000ML POUR BTL (IV SOLUTION) ×3 IMPLANT
PAD ARMBOARD 7.5X6 YLW CONV (MISCELLANEOUS) ×3 IMPLANT
POUCH RETRIEVAL ECOSAC 10 (ENDOMECHANICALS) ×1 IMPLANT
POUCH RETRIEVAL ECOSAC 10MM (ENDOMECHANICALS) ×2
SCISSORS LAP 5X35 DISP (ENDOMECHANICALS) ×3 IMPLANT
SET CHOLANGIOGRAPH 5 50 .035 (SET/KITS/TRAYS/PACK) ×3 IMPLANT
SET IRRIG TUBING LAPAROSCOPIC (IRRIGATION / IRRIGATOR) ×3 IMPLANT
SLEEVE ENDOPATH XCEL 5M (ENDOMECHANICALS) ×6 IMPLANT
SPECIMEN JAR SMALL (MISCELLANEOUS) ×3 IMPLANT
SPONGE GAUZE 2X2 STER 10/PKG (GAUZE/BANDAGES/DRESSINGS) ×2
STRIP CLOSURE SKIN 1/2X4 (GAUZE/BANDAGES/DRESSINGS) ×2 IMPLANT
SUT MNCRL AB 4-0 PS2 18 (SUTURE) ×5 IMPLANT
TOWEL OR 17X24 6PK STRL BLUE (TOWEL DISPOSABLE) ×3 IMPLANT
TOWEL OR 17X26 10 PK STRL BLUE (TOWEL DISPOSABLE) ×1 IMPLANT
TRAY LAPAROSCOPIC MC (CUSTOM PROCEDURE TRAY) ×3 IMPLANT
TROCAR XCEL BLUNT TIP 100MML (ENDOMECHANICALS) ×3 IMPLANT
TROCAR XCEL NON-BLD 5MMX100MML (ENDOMECHANICALS) ×3 IMPLANT
TUBING INSUFFLATION (TUBING) ×3 IMPLANT

## 2015-11-15 NOTE — Transfer of Care (Signed)
Immediate Anesthesia Transfer of Care Note  Patient: [name]  Procedure(s) Performed: Procedure(s): LAPAROSCOPIC CHOLECYSTECTOMY WITH INTRAOPERATIVE CHOLANGIOGRAM (N/A)  Patient Location: PACU  Anesthesia Type:General  Level of Consciousness: sedated, patient cooperative and responds to stimulation  Airway & Oxygen Therapy: Patient Spontanous Breathing and Patient connected to nasal cannula oxygen  Post-op Assessment: Report given to RN and Post -op Vital signs reviewed and stable  Post vital signs: Reviewed and stable  Last Vitals:  Filed Vitals:   11/15/15 0646  BP: 125/68  Pulse: 79  Temp: 36.8 C  Resp: 18    Last Pain:  Filed Vitals:   11/15/15 0659  PainSc: 3       Patients Stated Pain Goal: 2 (11/15/15 [id number])  Complications: No apparent anesthesia complications

## 2015-11-15 NOTE — Op Note (Signed)
[name] [id number] 1975/07/01 11/15/2015  Laparoscopic Cholecystectomy with IOC Procedure Note  Indications: This patient presents with symptomatic gallbladder disease and will undergo laparoscopic cholecystectomy.  Pre-operative Diagnosis: symptomatic cholelithiasis  Post-operative Diagnosis: chronic cholecystitis with calculous  Surgeon: Atilano Ina   Assistants: Myrtie Soman, RNFA  Anesthesia: General endotracheal anesthesia   Procedure Details  The patient was seen again in the Holding Room. The risks, benefits, complications, treatment options, and expected outcomes were discussed with the patient. The possibilities of reaction to medication, pulmonary aspiration, perforation of viscus, bleeding, recurrent infection, finding a normal gallbladder, the need for additional procedures, failure to diagnose a condition, the possible need to convert to an open procedure, and creating a complication requiring transfusion or operation were discussed with the patient. The likelihood of improving the patient's symptoms with return to their baseline status is good.  The patient and/or family concurred with the proposed plan, giving informed consent. The site of surgery properly noted. The patient was taken to Operating Room, identified as [name] and the procedure verified as Laparoscopic Cholecystectomy with Intraoperative Cholangiogram. A Time Out was held and the above information confirmed. Antibiotic prophylaxis was administered.   Prior to the induction of general anesthesia, antibiotic prophylaxis was administered. General endotracheal anesthesia was then administered and tolerated well. After the induction, the abdomen was prepped with Chloraprep and draped in the sterile fashion. The patient was positioned in the supine position.  Local anesthetic agent was injected into the skin near the umbilicus and an incision made. We dissected down to the abdominal fascia  with blunt dissection.  The fascia was incised vertically and we entered the peritoneal cavity bluntly.  A pursestring suture of 0-Vicryl was placed around the fascial opening.  The Hasson cannula was inserted and secured with the stay suture.  Pneumoperitoneum was then created with CO2 and tolerated well without any adverse changes in the patient's vital signs. An 5-mm port was placed in the subxiphoid position.  Two 5-mm ports were placed in the right upper quadrant. All skin incisions were infiltrated with a local anesthetic agent before making the incision and placing the trocars.   We positioned the patient in reverse Trendelenburg, tilted slightly to the patient's left.  The gallbladder was identified, the fundus grasped and retracted cephalad. Adhesions were lysed bluntly and with the electrocautery where indicated, taking care not to injure any adjacent organs or viscus. She had omentum around the body of the gallbladder which was taken down bluntly and with cautery. She had a 1 small omental vessel going from her omentum to her gallbladder wall on the lateral side of the GB. This was kept in tact til I had completely dissected out and got a critical view. The infundibulum was grasped and retracted laterally, exposing the peritoneum overlying the triangle of Calot. This was then divided and exposed in a blunt fashion. A critical view of the cystic duct and cystic artery was obtained.  The cystic duct was clearly identified and bluntly dissected circumferentially. There was nothing else entering the gallbladder on this side The cystic duct was ligated with a clip distally.   An incision was made in the cystic duct and the [facility] cholangiogram catheter introduced. The catheter was secured using a clip. A cholangiogram was then obtained which showed good visualization of the distal and proximal biliary tree with no sign of filling defects or obstruction.  Contrast flowed easily into the duodenum. The catheter  was then removed.   The cystic duct was  then ligated with clips and divided. The cystic artery which had been identified & dissected free was ligated with clips and divided as well.   The gallbladder was dissected from the liver bed in retrograde fashion with the electrocautery. The omental vessel was clipped and divided. The gallbladder was removed and placed in an Ecco sac.  The gallbladder and Ecco sac were then removed through the umbilical port site. The liver bed was irrigated and inspected. Hemostasis was achieved with the electrocautery. Copious irrigation was utilized and was repeatedly aspirated until clear.  The pursestring suture was used to close the umbilical fascia.    We again inspected the right upper quadrant for hemostasis.  The umbilical closure was inspected and there was no air leak and nothing trapped within the closure. Pneumoperitoneum was released as we removed the trocars.  4-0 Monocryl was used to close the skin.   Benzoin, steri-strips, and clean dressings were applied. The patient was then extubated and brought to the recovery room in stable condition. Instrument, sponge, and needle counts were correct at closure and at the conclusion of the case.   Findings: Chronic Cholecystitis with Cholelithiasis  Estimated Blood Loss: Minimal         Drains: none         Specimens: Gallbladder           Complications: None; patient tolerated the procedure well.         Disposition: PACU - hemodynamically stable.         Condition: stable  Suzanne SellaEric M. Andrey CampanileWilson, MD, FACS General, Bariatric, & Minimally Invasive Surgery [facility], [place]

## 2015-11-15 NOTE — Anesthesia Preprocedure Evaluation (Signed)

## 2015-11-15 NOTE — Anesthesia Procedure Notes (Signed)
Procedure Name: Intubation Date/Time: 11/15/2015 8:37 AM Performed by: Daiva EvesAVENEL, Cade Olberding W Pre-anesthesia Checklist: Patient identified, Timeout performed, Emergency Drugs available, Suction available and Patient being monitored Patient Re-evaluated:Patient Re-evaluated prior to inductionOxygen Delivery Method: Circle system utilized Preoxygenation: Pre-oxygenation with 100% oxygen Intubation Type: IV induction Ventilation: Mask ventilation without difficulty Laryngoscope Size: 3 and Mac Grade View: Grade I Tube type: Oral Tube size: 7.0 mm Number of attempts: 1 Placement Confirmation: ETT inserted through vocal cords under direct vision,  breath sounds checked- equal and bilateral,  positive ETCO2 and CO2 detector Secured at: 22 cm Tube secured with: Tape Dental Injury: Teeth and Oropharynx as per pre-operative assessment  Comments: Performed by Christena FlakeJen Cambell SRNA

## 2015-11-15 NOTE — Interval H&P Note (Signed)
History and Physical Interval Note:  11/15/2015 8:19 AM  [name]  has presented today for surgery, with the diagnosis of Symptomatic cholelithiasis  The various methods of treatment have been discussed with the patient and family. After consideration of risks, benefits and other options for treatment, the patient has consented to  Procedure(s): LAPAROSCOPIC CHOLECYSTECTOMY WITH POSSIBLE  INTRAOPERATIVE CHOLANGIOGRAM (N/A) as a surgical intervention .  The patient's history has been reviewed, patient examined, no change in status, stable for surgery.  I have reviewed the patient's chart and labs.  Questions were answered to the patient's satisfaction.    Mary SellaEric M. Andrey CampanileWilson, MD, FACS General, Bariatric, & Minimally Invasive Surgery [facility], [place]   [facility],Jeferson Boozer M

## 2015-11-15 NOTE — Progress Notes (Signed)
Feels better after IV Zofran and IVF

## 2015-11-15 NOTE — Discharge Instructions (Signed)
[facility] LAPAROSCOPIC SURGERY: POST OP INSTRUCTIONS Always review your discharge instruction sheet given to you by the facility where your surgery was performed. IF YOU HAVE DISABILITY OR FAMILY LEAVE FORMS, YOU MUST BRING THEM TO THE OFFICE FOR PROCESSING.   DO NOT GIVE THEM TO YOUR DOCTOR.  1. A prescription for pain medication may be given to you upon discharge.  Take your pain medication as prescribed, if needed.  If narcotic pain medicine is not needed, then you may take acetaminophen (Tylenol) or ibuprofen (Advil) as needed. 2. Take your usually prescribed medications unless otherwise directed. 3. If you need a refill on your pain medication, please contact your pharmacy.  They will contact our office to request authorization. Prescriptions will not be filled after 5pm or on week-ends. 4. You should follow a light diet the first few days after arrival home, such as soup and crackers, etc.  Be sure to include lots of fluids daily. 5. Most patients will experience some swelling and bruising in the area of the incisions.  Ice packs will help.  Swelling and bruising can take several days to resolve.  6. It is common to experience some constipation if taking pain medication after surgery.  Increasing fluid intake and taking a stool softener (such as Colace) will usually help or prevent this problem from occurring.  A mild laxative (Milk of Magnesia or Miralax) should be taken according to package instructions if there are no bowel movements after 48 hours. 7. Unless discharge instructions indicate otherwise, you may remove your bandages 48 hours after surgery, and you may shower at that time.  You have steri-strips (small skin tapes) in place directly over the incision.  These strips should be left on the skin for 7-10 days.   8. ACTIVITIES:  You may resume regular (light) daily activities beginning the next day--such as daily self-care, walking, climbing stairs--gradually  increasing activities as tolerated.  You may have sexual intercourse when it is comfortable.  Refrain from any heavy lifting or straining until approved by your doctor. a. You may drive when you are no longer taking prescription pain medication, you can comfortably wear a seatbelt, and you can safely maneuver your car and apply brakes. 9. You should see your doctor in the office for a follow-up appointment approximately 2-3 weeks after your surgery.  Make sure that you call for this appointment within a day or two after you arrive home to insure a convenient appointment time. 10. OTHER INSTRUCTIONS: do not lift, push, or pull anything greater than 10 lbs for 2 weeks  WHEN TO CALL YOUR DOCTOR: 1. Fever over 101.0 2. Inability to urinate 3. Continued bleeding from incision. 4. Increased pain, redness, or drainage from the incision. 5. Increasing abdominal pain  The clinic staff is available to answer your questions during regular business hours.  Please dont hesitate to call and ask to speak to one of the nurses for clinical concerns.  If you have a medical emergency, go to the nearest emergency room or call 911.  A surgeon from [facility] is always on call at the hospital. [address], [address], [place], [place]  [id number] ? [address] [address], [place], [place]   [id number] [phone] ? [phone] ? [phone] [phone] Web site: [email or web address]
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
## 2015-11-16 ENCOUNTER — Encounter (HOSPITAL_COMMUNITY): Payer: Self-pay | Admitting: General Surgery

## 2015-11-18 NOTE — Anesthesia Postprocedure Evaluation (Signed)
Anesthesia Post Note  Patient: [name]  Procedure(s) Performed: Procedure(s) (LRB): LAPAROSCOPIC CHOLECYSTECTOMY WITH INTRAOPERATIVE CHOLANGIOGRAM (N/A)  Patient location during evaluation: PACU Anesthesia Type: General Level of consciousness: awake, awake and alert, oriented and sedated Pain management: pain level controlled Vital Signs Assessment: post-procedure vital signs reviewed and stable Respiratory status: spontaneous breathing, nonlabored ventilation and respiratory function stable Cardiovascular status: blood pressure returned to baseline Anesthetic complications: no    Last Vitals:  Filed Vitals:   11/15/15 1144 11/15/15 1205  BP: 123/65 116/70  Pulse: 71 65  Temp:    Resp: 16 16    Last Pain:  Filed Vitals:   11/17/15 1434  PainSc: 3                  Dannell Raczkowski COKER

## 2016-06-02 ENCOUNTER — Other Ambulatory Visit: Payer: Self-pay | Admitting: Obstetrics and Gynecology

## 2016-06-02 DIAGNOSIS — Z1231 Encounter for screening mammogram for malignant neoplasm of breast: Secondary | ICD-10-CM

## 2016-07-03 IMAGING — US US ABDOMEN COMPLETE
1 series · 14 of 25 positions shown · non-contrast
Comparison: None

CLINICAL DATA: Sporadic chronic generalized abdominal pain, nausea
and vomiting for 2-3 years, no pattern to symptoms, abdominal pain
for several months

EXAM:
ULTRASOUND ABDOMEN COMPLETE

[Series 1: us abdomen complete · 0.25mm/px · 14 of 63 slices shown]
[im 1/63]
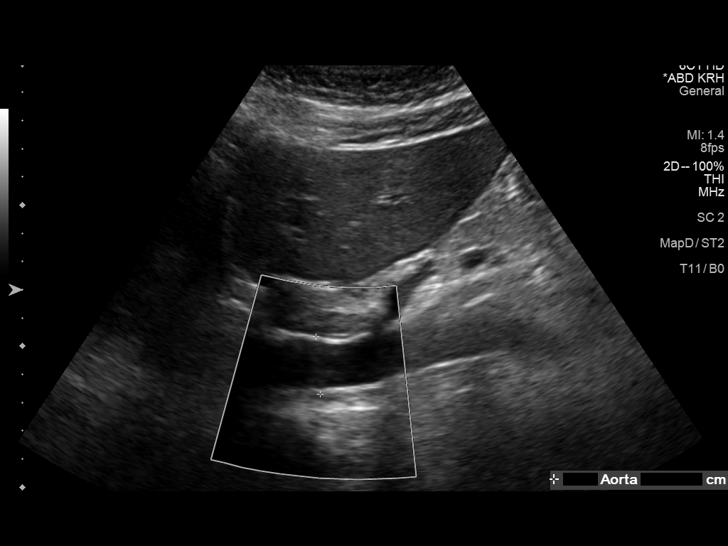
[im 6/63]
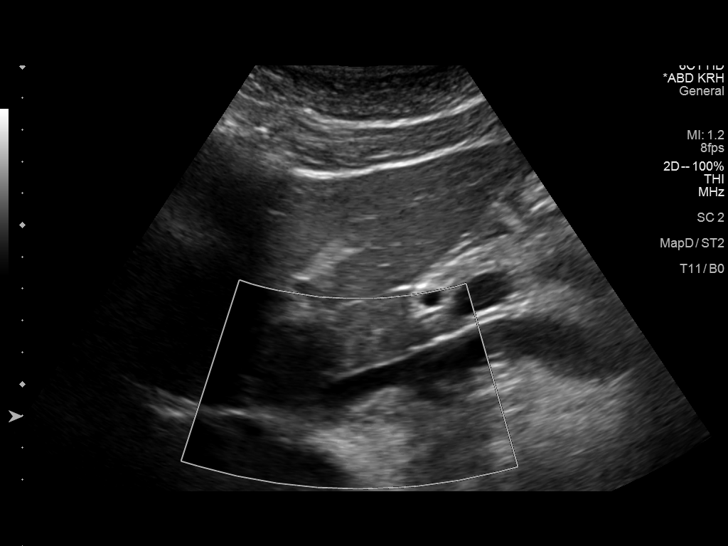
[im 11/63]
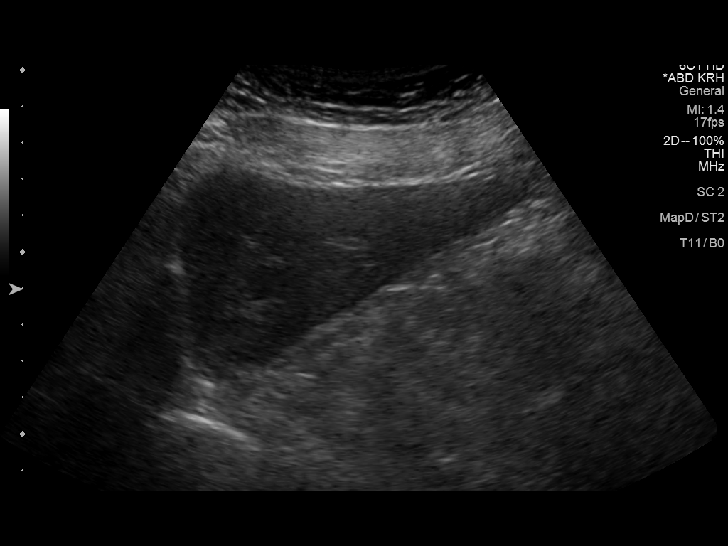
[im 16/63]
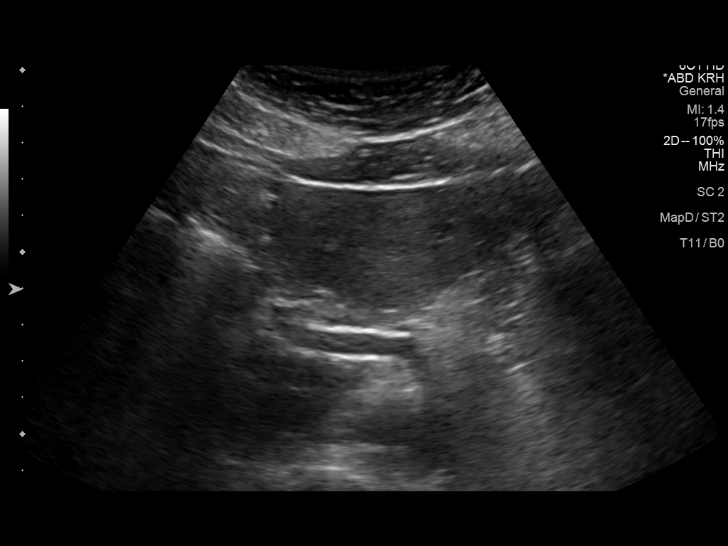
[im 21/63]
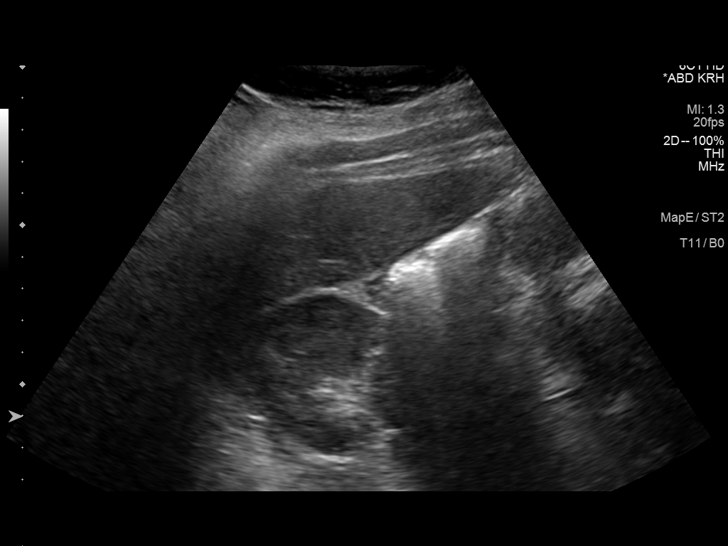
[im 24/63]
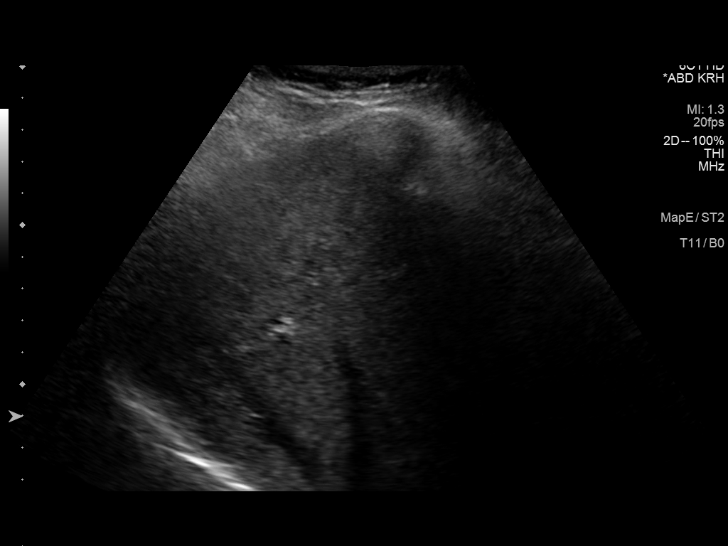
[im 29/63]
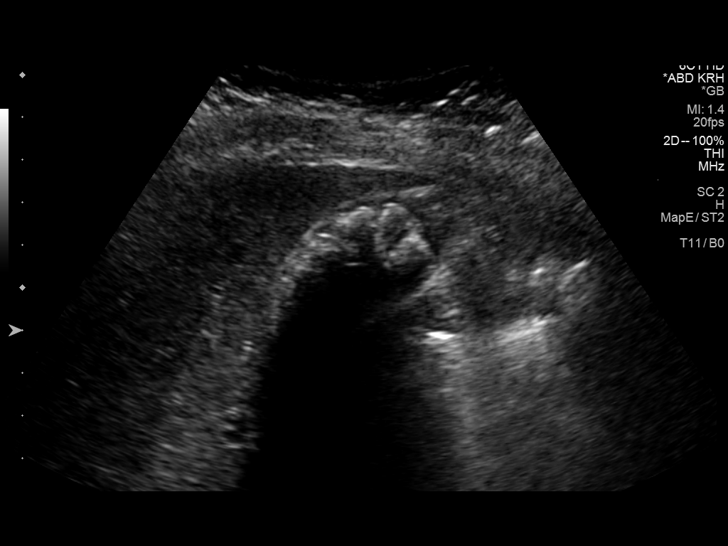
[im 34/63]
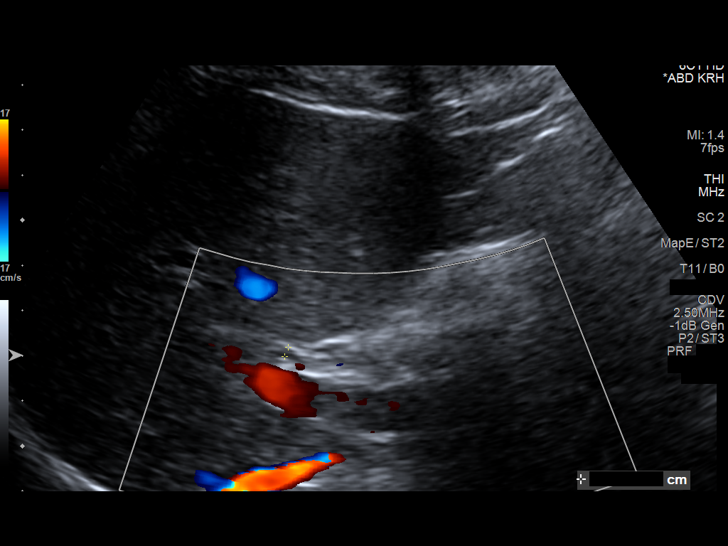
[im 39/63]
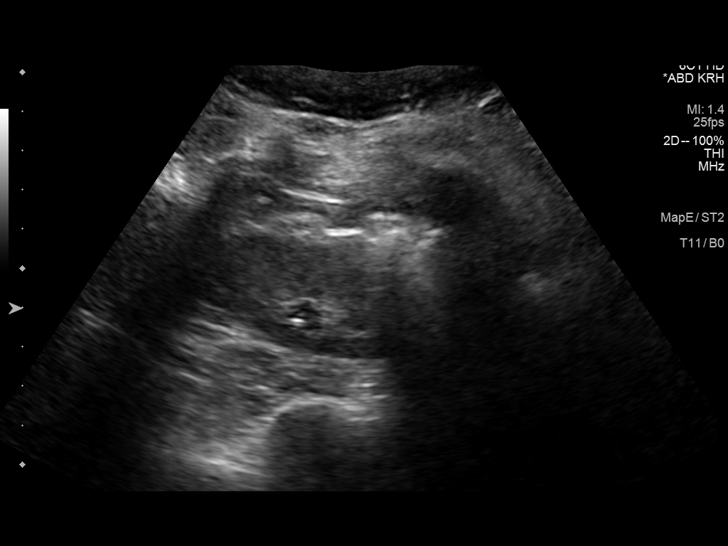
[im 42/63]
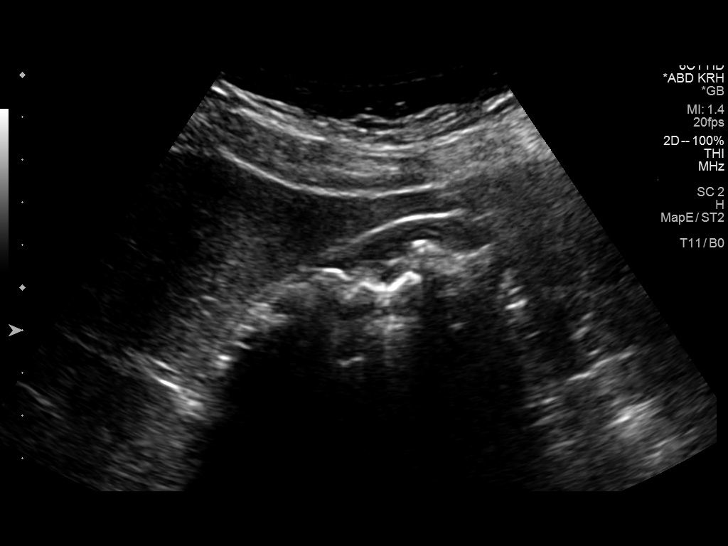
[im 47/63]
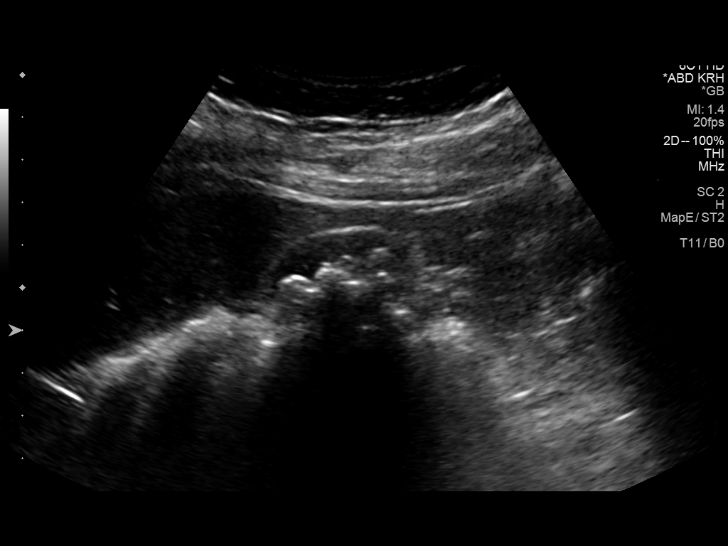
[im 52/63]
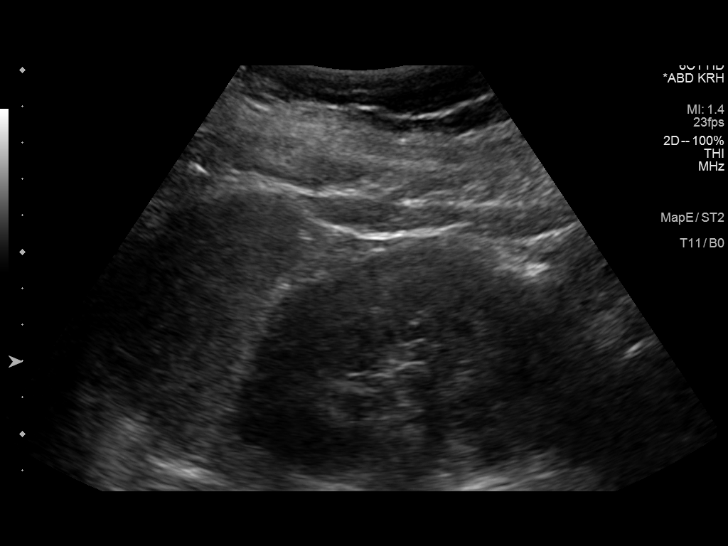
[im 57/63]
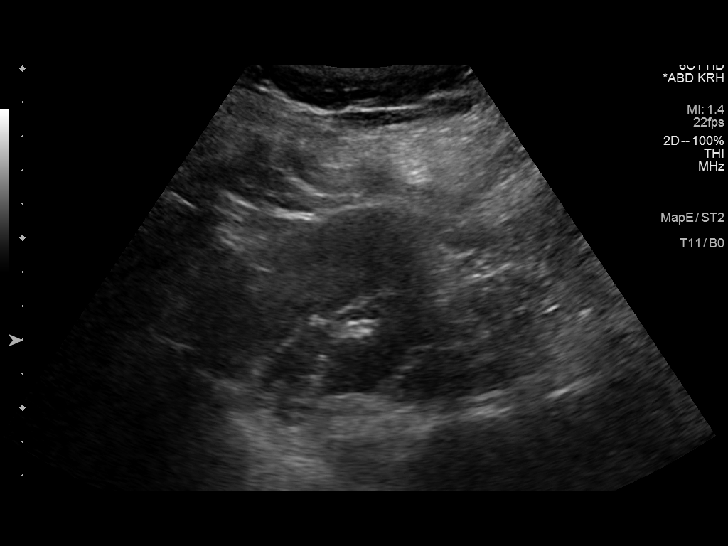
[im 63/63]
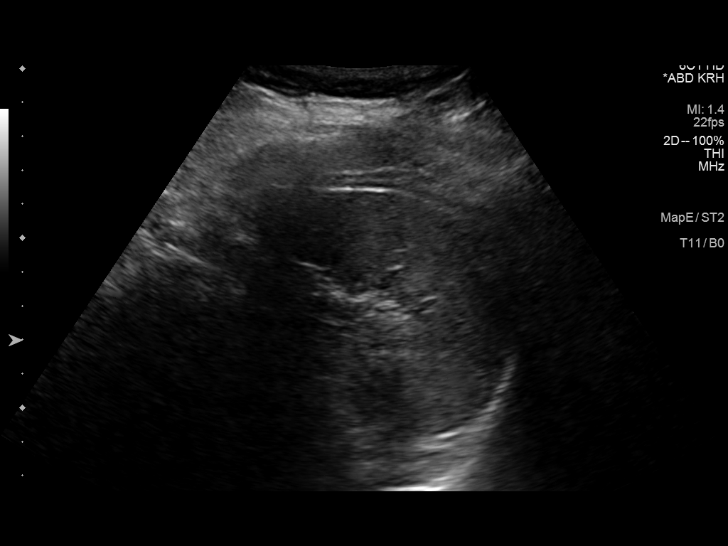

[14 of 25 positions shown; findings below may reference images not displayed]

FINDINGS: Gallbladder: Multiple shadowing calculi within gallbladder measuring
up to at least 10 mm in diameter. No gallbladder wall thickening,
pericholecystic fluid or sonographic Murphy sign.

Common bile duct: Diameter: Normal caliber 2 mm diameter

Liver: Normal appearance

IVC: Short segment of IVC is normal appearance, remainder obscured
by bowel gas.

Pancreas: Normal appearance

Spleen: Normal appearance, 7.7 cm length

Right Kidney: Length: 10.0 cm. Normal morphology without mass or
hydronephrosis.

Left Kidney: Length: 9.9 cm. Normal morphology without mass or
hydronephrosis.

Abdominal aorta: Grossly normal caliber

Other findings: No free-fluid
IMPRESSION: Cholelithiasis without evidence acute cholecystitis or biliary
obstruction.

## 2016-07-31 LAB — HM MAMMOGRAPHY

## 2017-07-23 LAB — HM PAP SMEAR: HM Pap smear: NORMAL

## 2017-07-30 LAB — LIPID PANEL
Cholesterol: 176 (ref 0–200)
HDL: 42 (ref 35–70)
LDL Cholesterol: 118
Triglycerides: 72 (ref 40–160)

## 2017-07-30 LAB — VITAMIN D 25 HYDROXY (VIT D DEFICIENCY, FRACTURES): VIT D 25 HYDROXY: 27

## 2017-07-30 LAB — VITAMIN B12: VITAMIN B 12: 476

## 2017-07-30 LAB — HEMOGLOBIN A1C: HEMOGLOBIN A1C: 5.1

## 2017-07-30 LAB — TSH: TSH: 0.7 (ref <=5.90)

## 2017-10-12 IMAGING — US US ABDOMEN LIMITED
1 series · 14 of 25 positions shown · non-contrast
Comparison: Ultrasound April 18, 2014.

CLINICAL DATA: Chronic right upper quadrant abdominal pain.

EXAM:
US ABDOMEN LIMITED - RIGHT UPPER QUADRANT

[Series 1: us abdomen limited · 0.19mm/px · 14 of 49 slices shown]
[im 1/49]
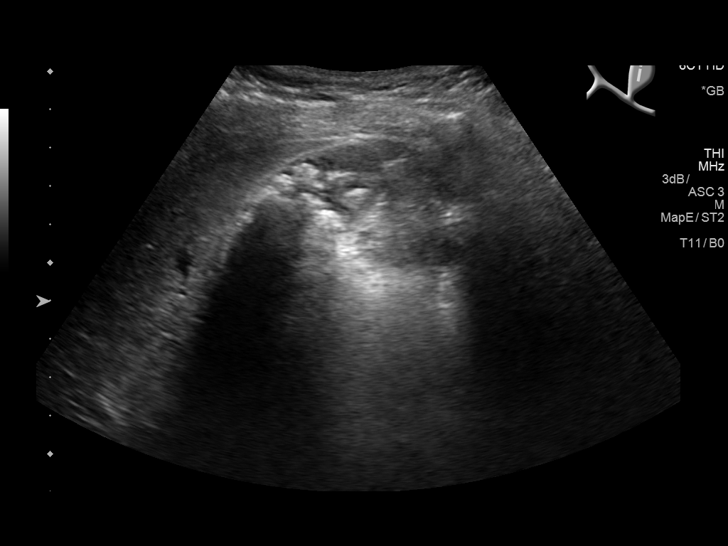
[im 5/49]
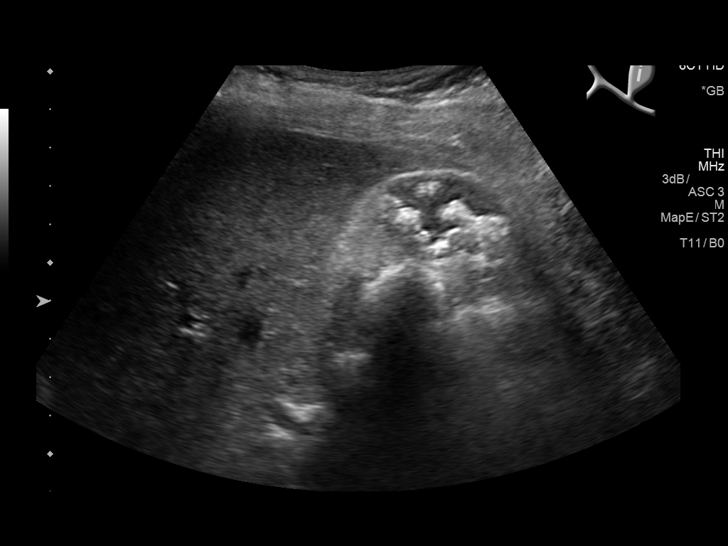
[im 9/49]
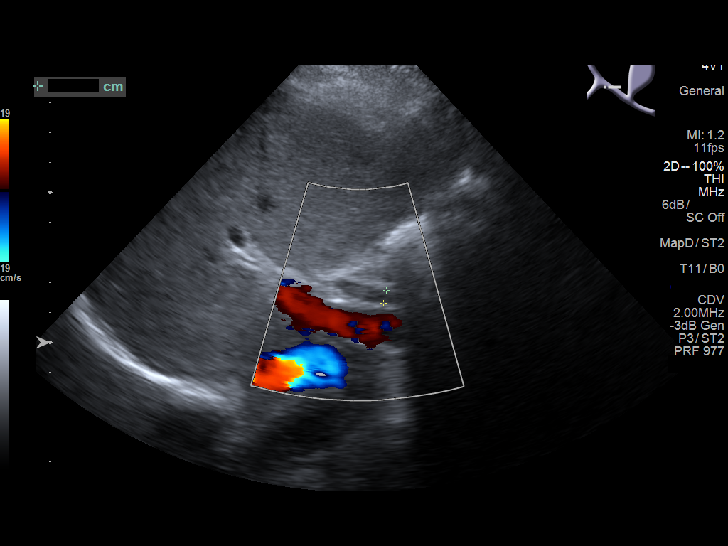
[im 13/49]
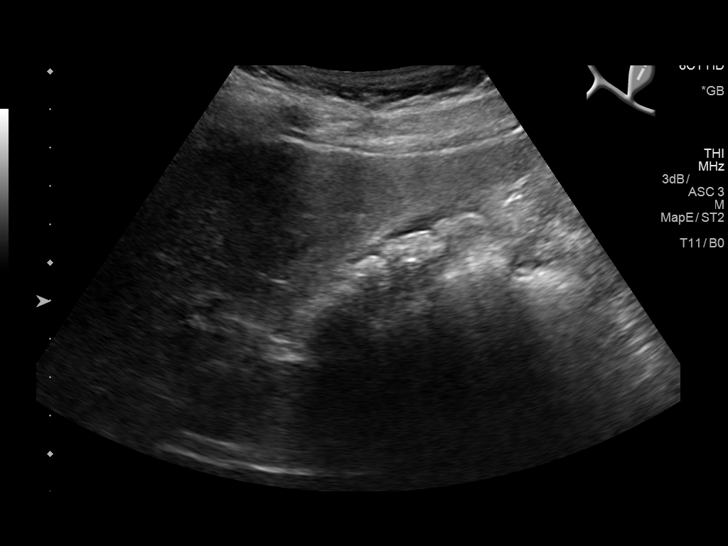
[im 17/49]
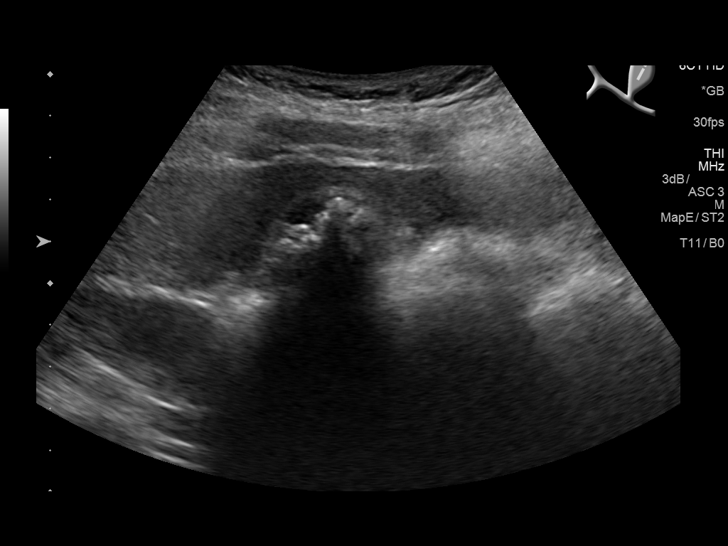
[im 19/49]
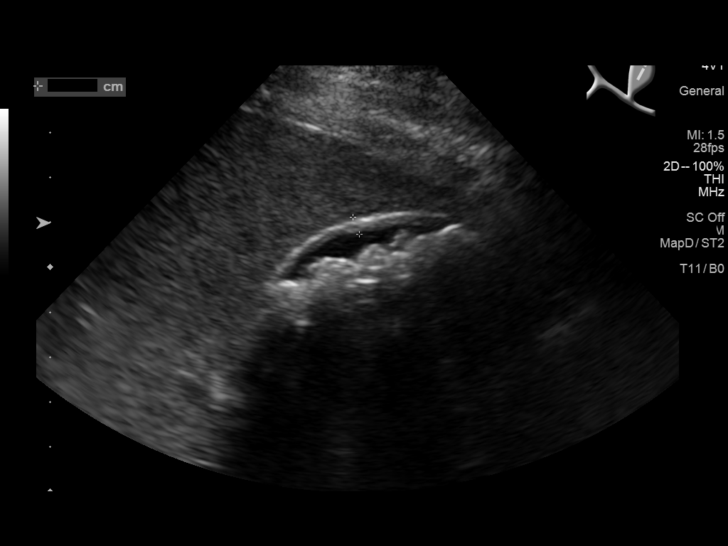
[im 23/49]
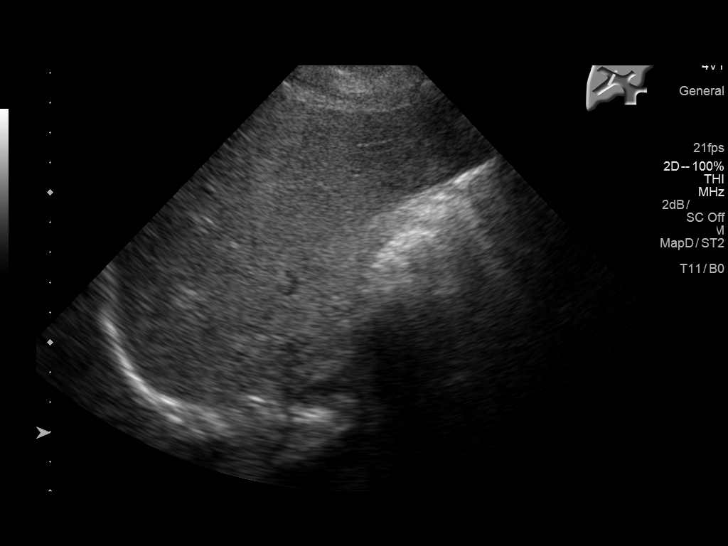
[im 27/49]
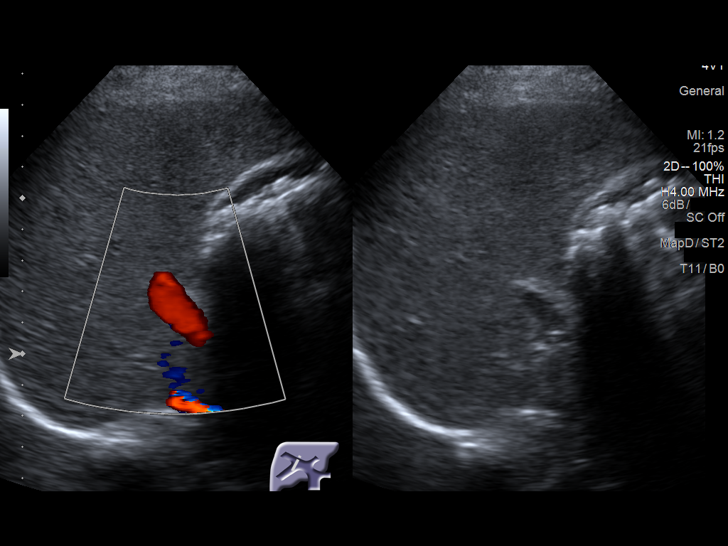
[im 31/49]
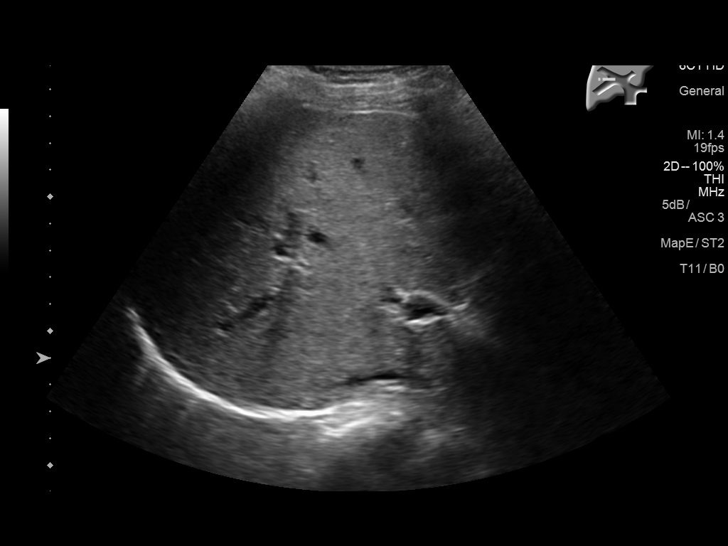
[im 33/49]
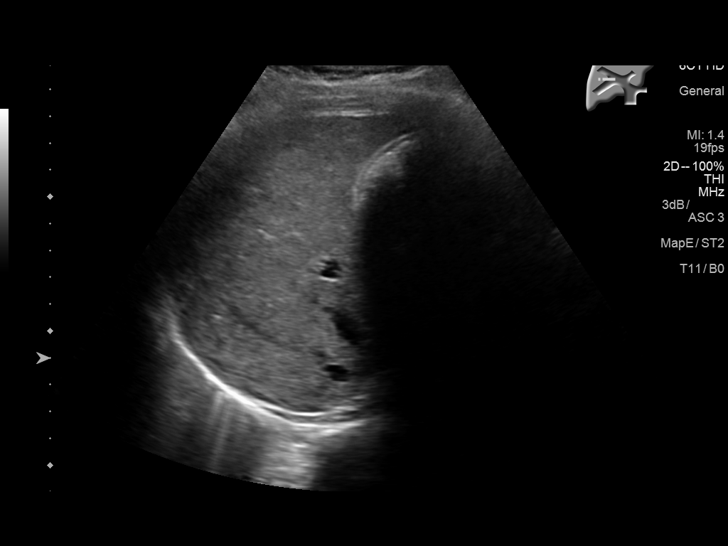
[im 37/49]
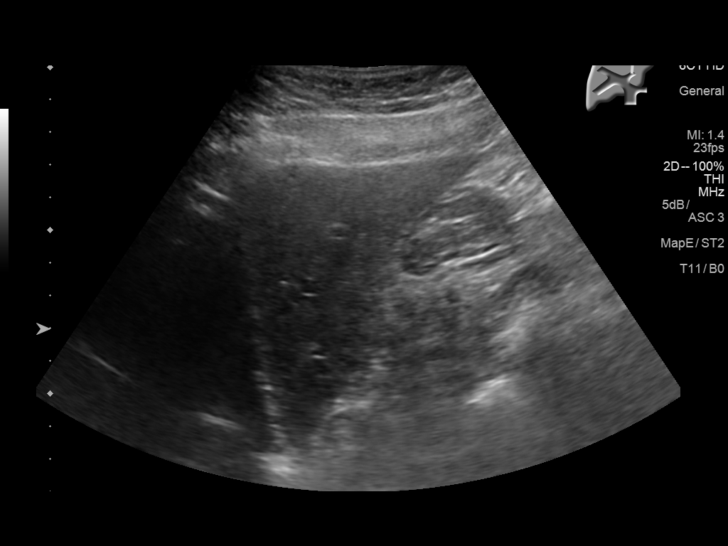
[im 41/49]
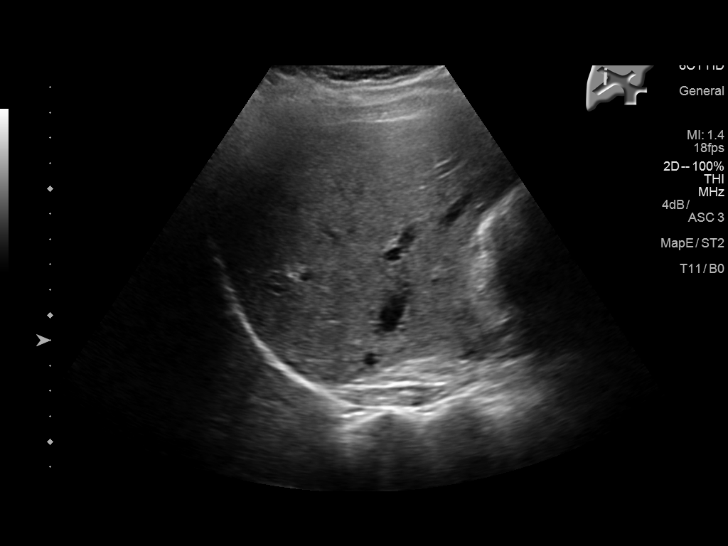
[im 45/49]
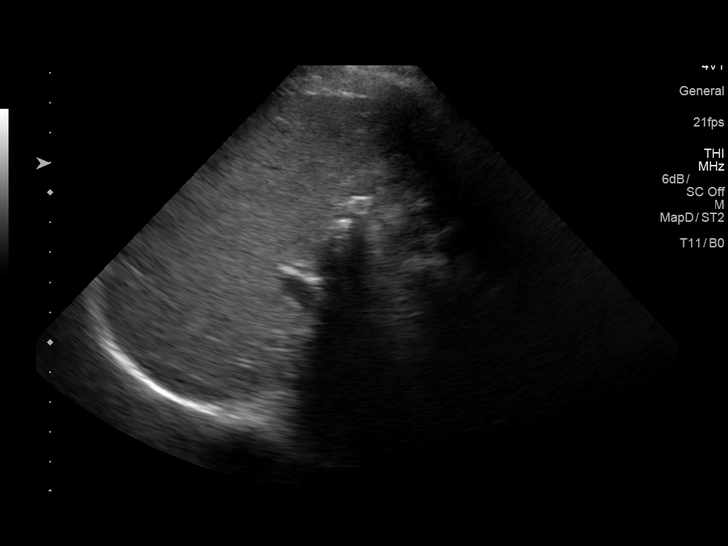
[im 49/49]
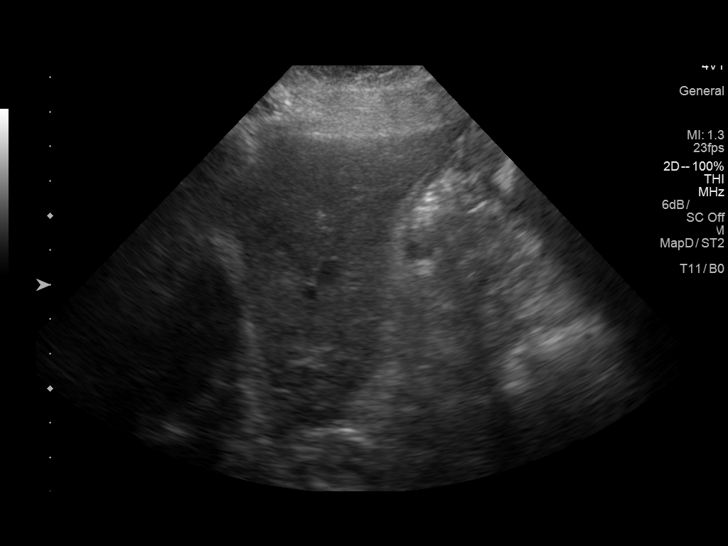

[14 of 25 positions shown; findings below may reference images not displayed]

FINDINGS: Gallbladder:

Multiple gallstones are again noted with minimal gallbladder wall
thickening measured at 4 mm. No pericholecystic fluid or sonographic
Murphy's sign is noted.

Common bile duct:

Diameter: 4.4 mm which is within normal limits.

Liver:

No focal lesion identified. Within normal limits in parenchymal
echogenicity.
IMPRESSION: Cholelithiasis is noted with minimal gallbladder wall thickening,
but no pericholecystic fluid or sonographic Murphy's sign. If there
is clinical concern for cholecystitis, HIDA scan may be performed
for further evaluation.

## 2017-11-19 ENCOUNTER — Ambulatory Visit: Payer: BC Managed Care – PPO | Admitting: Physician Assistant

## 2017-11-19 ENCOUNTER — Other Ambulatory Visit: Payer: Self-pay

## 2017-11-19 ENCOUNTER — Encounter: Payer: Self-pay | Admitting: Physician Assistant

## 2017-11-19 VITALS — BP 112/78 | HR 84 | Temp 98.2°F | Resp 16 | Ht 67.0 in | Wt 239.6 lb

## 2017-11-19 DIAGNOSIS — F329 Major depressive disorder, single episode, unspecified: Secondary | ICD-10-CM

## 2017-11-19 DIAGNOSIS — Z1239 Encounter for other screening for malignant neoplasm of breast: Secondary | ICD-10-CM

## 2017-11-19 DIAGNOSIS — R4589 Other symptoms and signs involving emotional state: Secondary | ICD-10-CM | POA: Insufficient documentation

## 2017-11-19 DIAGNOSIS — Z1231 Encounter for screening mammogram for malignant neoplasm of breast: Secondary | ICD-10-CM | POA: Diagnosis not present

## 2017-11-19 MED ORDER — ESCITALOPRAM OXALATE 20 MG PO TABS: ORAL_TABLET | ORAL | 2 refills | Status: DC

## 2017-11-19 NOTE — Assessment & Plan Note (Signed)
Pharmacotherapy discussed. Will start Lexapro at 10 mg x 2 weeks, then increase to 20 mg daily. Follow-up in 4-6 weeks.

## 2017-11-19 NOTE — Patient Instructions (Signed)
Please start the Lexapro taking as directed: - 1/2 tablet by mouth daily for 2 weeks. If tolerating well after 2 weeks, increase to a whole tablet daily (20 mg).  Follow-up 6 weeks for reassessment.

## 2017-11-19 NOTE — Progress Notes (Signed)
Patient presents to clinic today to re-establish care.  Chronic Issues: Depression -- Long-standing history. Has been trying to manage on her own and with counseling over the past few years. Denies excessive depressed mood but notes significant anhedonia, denies anxiety or panic. Notes she is stress eating significantly which is not healthy. Notes change in sleep -- hypersomnolence. Was recently given Wellbutrin and Citalopram which she tolerated but noted no improvement in her symptoms despite being on this regimen for a few months. Notes she had thyroid studies, b12 and vitamin d levels assessed that were all within normal range. Denies SI/HI.  Health Maintenance: Immunizations -- up-to-date.  Mammogram -- Due Mammogram. Last in 2018 per patient. Denies acute concerns.  PAP -- Up-to-date per patient. Followed by Dr. Rana Snare. Normal paps per patient.   Past Medical History:  Diagnosis Date  . Acid indigestion    "sometimes"  . Anxiety   . Depression   . Gallstones   . Glaucoma   . Headache    "sometimes"    Past Surgical History:  Procedure Laterality Date  . CESAREAN SECTION    . CHOLECYSTECTOMY N/A 11/15/2015   Procedure: LAPAROSCOPIC CHOLECYSTECTOMY WITH INTRAOPERATIVE CHOLANGIOGRAM;  Surgeon: Gaynelle Adu, MD;  [facility]: [facility];  Service: General;  Laterality: N/A;  . dental implant     March 2019  . FOOT SURGERY     Bilateral  . GALLBLADDER SURGERY     2016  . lasic    . TOOTH EXTRACTION    . WISDOM TOOTH EXTRACTION      Current Outpatient Medications on File Prior to Visit  Medication Sig Dispense Refill  . meloxicam (MOBIC) 15 MG tablet Take 15 mg by mouth daily.     No current facility-administered medications on file prior to visit.     No Known Allergies  Family History  Problem Relation Age of Onset  . Healthy Mother        Living  . Healthy Father        Living  . Breast cancer Maternal Grandmother   . Stroke Paternal Grandmother   . Diabetes Maternal  Uncle   . Kidney disease Maternal Uncle   . Hyperlipidemia Paternal Uncle   . Hypertension Paternal Uncle   . Healthy Brother   . Healthy Sister   . Healthy Daughter        x2    Social History   Socioeconomic History  . Marital status: Divorced    Spouse name: Not on file  . Number of children: Not on file  . Years of education: Not on file  . Highest education level: Not on file  Occupational History  . Not on file  Social Needs  . Financial resource strain: Not on file  . Food insecurity:    Worry: Not on file    Inability: Not on file  . Transportation needs:    Medical: Not on file    Non-medical: Not on file  Tobacco Use  . Smoking status: Never Smoker  . Smokeless tobacco: Never Used  Substance and Sexual Activity  . Alcohol use: No  . Drug use: No  . Sexual activity: Never  Lifestyle  . Physical activity:    Days per week: Not on file    Minutes per session: Not on file  . Stress: Not on file  Relationships  . Social connections:    Talks on phone: Not on file    Gets together: Not on file  Attends religious service: Not on file    Active member of club or organization: Not on file    Attends meetings of clubs or organizations: Not on file    Relationship status: Not on file  . Intimate partner violence:    Fear of current or ex partner: Not on file    Emotionally abused: Not on file    Physically abused: Not on file    Forced sexual activity: Not on file  Other Topics Concern  . Not on file  Social History Narrative  . Not on file   Review of Systems  Constitutional: Negative for fever and malaise/fatigue.  Cardiovascular: Negative for chest pain and palpitations.  Neurological: Negative for dizziness and headaches.  Psychiatric/Behavioral: Positive for depression. Negative for hallucinations, memory loss, substance abuse and suicidal ideas. The patient is not nervous/anxious and does not have insomnia.    BP 112/78   Pulse 84   Temp 98.2  F (36.8 C) (Oral)   Resp 16   Ht  (1.702 m)   Wt 239 lb 9.6 oz (108.7 kg)   SpO2 97%   BMI 37.53 kg/m   Physical Exam  Constitutional: She appears well-developed and well-nourished.  HENT:  Head: Normocephalic and atraumatic.  Eyes: Pupils are equal, round, and reactive to light. EOM are normal.  Neck: Neck supple. No thyromegaly present.  Cardiovascular: Normal rate, regular rhythm and normal heart sounds.  Pulmonary/Chest: Effort normal and breath sounds normal. No stridor. No respiratory distress. She has no wheezes. She has no rales. She exhibits no tenderness.  Skin: Skin is warm and dry.  Psychiatric: Her behavior is normal. Judgment and thought content normal. Her mood appears not anxious. She exhibits a depressed mood.  Vitals reviewed.  Assessment/Plan: Breast cancer screening Order for screening mammogram placed today.  Depressed mood Pharmacotherapy discussed. Will start Lexapro at 10 mg x 2 weeks, then increase to 20 mg daily. Follow-up in 4-6 weeks.     Piedad Climes, PA-C

## 2017-11-19 NOTE — Assessment & Plan Note (Signed)
Order for screening mammogram placed today. 

## 2017-12-01 ENCOUNTER — Encounter: Payer: Self-pay | Admitting: Emergency Medicine

## 2018-01-04 ENCOUNTER — Ambulatory Visit: Payer: BC Managed Care – PPO | Admitting: Physician Assistant

## 2018-01-06 ENCOUNTER — Other Ambulatory Visit: Payer: Self-pay

## 2018-01-06 ENCOUNTER — Encounter: Payer: Self-pay | Admitting: Physician Assistant

## 2018-01-06 ENCOUNTER — Ambulatory Visit: Payer: BC Managed Care – PPO | Admitting: Physician Assistant

## 2018-01-06 VITALS — BP 100/64 | HR 64 | Temp 98.3°F | Resp 16 | Ht 67.0 in | Wt 238.0 lb

## 2018-01-06 DIAGNOSIS — F329 Major depressive disorder, single episode, unspecified: Secondary | ICD-10-CM

## 2018-01-06 DIAGNOSIS — L700 Acne vulgaris: Secondary | ICD-10-CM | POA: Diagnosis not present

## 2018-01-06 DIAGNOSIS — F32A Depression, unspecified: Secondary | ICD-10-CM

## 2018-01-06 MED ORDER — SERTRALINE HCL 50 MG PO TABS: 50.0000 mg | ORAL_TABLET | Freq: Every day | ORAL | 3 refills | Status: DC

## 2018-01-06 MED ORDER — CLINDAMYCIN PHOS-BENZOYL PEROX 1-5 % EX GEL: Freq: Two times a day (BID) | CUTANEOUS | 0 refills | Status: DC

## 2018-01-06 NOTE — Patient Instructions (Signed)
Please stop the Lexapro and start the Sertraline as directed. We will follow-up in 2-3 weeks for this.   Restart the Benzaclin gel as directed for acne. Increase water intake.  Schedule an appointment for fasting labs -- these need to be done in the morning for accuracy.

## 2018-01-06 NOTE — Progress Notes (Signed)
   Patient presents to clinic today for follow-up of depression. At last visit, patient was started on Lexapro 10 mg x 2 weeks, increasing to 20 mg daily. Has take as directed without any change in mood. Notes medication makes her feel very sleepy. Denies and worsening mood on medication. Denies SI/HI.   Past Medical History:  Diagnosis Date  . Acid indigestion    "sometimes"  . Anxiety   . Depression   . Gallstones   . Glaucoma   . Headache    "sometimes"    No current outpatient medications on file prior to visit.   No current facility-administered medications on file prior to visit.     No Known Allergies  Family History  Problem Relation Age of Onset  . Healthy Mother        Living  . Healthy Father        Living  . Breast cancer Maternal Grandmother   . Stroke Paternal Grandmother   . Diabetes Maternal Uncle   . Kidney disease Maternal Uncle   . Hyperlipidemia Paternal Uncle   . Hypertension Paternal Uncle   . Healthy Brother   . Healthy Sister   . Healthy Daughter        x2    Social History   Socioeconomic History  . Marital status: Divorced    Spouse name: Not on file  . Number of children: Not on file  . Years of education: Not on file  . Highest education level: Not on file  Occupational History  . Not on file  Social Needs  . Financial resource strain: Not on file  . Food insecurity:    Worry: Not on file    Inability: Not on file  . Transportation needs:    Medical: Not on file    Non-medical: Not on file  Tobacco Use  . Smoking status: Never Smoker  . Smokeless tobacco: Never Used  Substance and Sexual Activity  . Alcohol use: No  . Drug use: No  . Sexual activity: Never  Lifestyle  . Physical activity:    Days per week: Not on file    Minutes per session: Not on file  . Stress: Not on file  Relationships  . Social connections:    Talks on phone: Not on file    Gets together: Not on file    Attends religious service: Not on file      Active member of club or organization: Not on file    Attends meetings of clubs or organizations: Not on file    Relationship status: Not on file  Other Topics Concern  . Not on file  Social History Narrative  . Not on file   Review of Systems - See HPI.  All other ROS are negative.  BP 100/64   Pulse 64   Temp 98.3 F (36.8 C) (Oral)   Resp 16   Ht 5\' 7"  (1.702 m)   Wt 238 lb (108 kg)   SpO2 98%   BMI 37.28 kg/m   Physical Exam  Constitutional: She appears well-developed and well-nourished.  HENT:  Head: Normocephalic and atraumatic.  Eyes: Conjunctivae are normal.  Cardiovascular: Normal rate, regular rhythm, normal heart sounds and intact distal pulses.  Pulmonary/Chest: Effort normal and breath sounds normal.  Psychiatric: She has a normal mood and affect.  Vitals reviewed.  Assessment/Plan:   Piedad ClimesWilliam Cody Fahim Kats, PA-C

## 2018-01-07 DIAGNOSIS — L700 Acne vulgaris: Secondary | ICD-10-CM | POA: Insufficient documentation

## 2018-01-07 DIAGNOSIS — F32A Depression, unspecified: Secondary | ICD-10-CM | POA: Insufficient documentation

## 2018-01-07 DIAGNOSIS — F329 Major depressive disorder, single episode, unspecified: Secondary | ICD-10-CM | POA: Insufficient documentation

## 2018-01-07 NOTE — Assessment & Plan Note (Signed)
Lexapro subtherapeutic and causing fatigue.  Stop Lexapro will start trial of Sertraline 50 mg daily. If tolerating well, will increase to 100 mg.  Follow-up discussed.

## 2018-01-07 NOTE — Assessment & Plan Note (Signed)
Refill Benzaclin gel today.

## 2018-01-25 ENCOUNTER — Ambulatory Visit: Payer: BC Managed Care – PPO | Admitting: Physician Assistant

## 2018-01-27 ENCOUNTER — Encounter: Payer: Self-pay | Admitting: Physician Assistant

## 2018-01-27 ENCOUNTER — Other Ambulatory Visit: Payer: Self-pay

## 2018-01-27 ENCOUNTER — Ambulatory Visit: Payer: BC Managed Care – PPO | Admitting: Physician Assistant

## 2018-01-27 VITALS — BP 112/81 | HR 82 | Temp 99.0°F | Resp 16 | Ht 67.0 in | Wt 239.1 lb

## 2018-01-27 DIAGNOSIS — F329 Major depressive disorder, single episode, unspecified: Secondary | ICD-10-CM

## 2018-01-27 DIAGNOSIS — F32A Depression, unspecified: Secondary | ICD-10-CM

## 2018-01-27 NOTE — Patient Instructions (Signed)
I am glad you are doing better.  Please talk to your HR representative about sending me papers for intermittent FMLA coverage. Let me know if symptoms do not continue to improve over the next few weeks and we can consider increasing the dose of Sertraline.  Follow-up with me in 3 months.  Return sooner if needed.

## 2018-01-27 NOTE — Assessment & Plan Note (Signed)
Improving. Will continue Sertraline at current dose. She will call if symptoms are not continuing to improve over the next few weeks. We can then consider increase to 100 mg daily.

## 2018-01-27 NOTE — Progress Notes (Signed)
Patient presents to clinic today for follow-up of depression. At last visit patient's sertraline was increased to 50 mg. Endorses taking medication as directed and noting improvement in mood. Notes sleep is somewhat improved with medication. Still waking up throughout the night. Denies change in appetite. Denies SI/HI.  Past Medical History:  Diagnosis Date  . Acid indigestion    "sometimes"  . Anxiety   . Depression   . Gallstones   . Glaucoma   . Headache    "sometimes"    Current Outpatient Medications on File Prior to Visit  Medication Sig Dispense Refill  . sertraline (ZOLOFT) 50 MG tablet Take 1 tablet (50 mg total) by mouth daily. 30 tablet 3   No current facility-administered medications on file prior to visit.     No Known Allergies  Family History  Problem Relation Age of Onset  . Healthy Mother        Living  . Healthy Father        Living  . Breast cancer Maternal Grandmother   . Stroke Paternal Grandmother   . Diabetes Maternal Uncle   . Kidney disease Maternal Uncle   . Hyperlipidemia Paternal Uncle   . Hypertension Paternal Uncle   . Healthy Brother   . Healthy Sister   . Healthy Daughter        x2    Social History   Socioeconomic History  . Marital status: Divorced    Spouse name: Not on file  . Number of children: Not on file  . Years of education: Not on file  . Highest education level: Not on file  Occupational History  . Not on file  Social Needs  . Financial resource strain: Not on file  . Food insecurity:    Worry: Not on file    Inability: Not on file  . Transportation needs:    Medical: Not on file    Non-medical: Not on file  Tobacco Use  . Smoking status: Never Smoker  . Smokeless tobacco: Never Used  Substance and Sexual Activity  . Alcohol use: No  . Drug use: No  . Sexual activity: Never  Lifestyle  . Physical activity:    Days per week: Not on file    Minutes per session: Not on file  . Stress: Not on file    Relationships  . Social connections:    Talks on phone: Not on file    Gets together: Not on file    Attends religious service: Not on file    Active member of club or organization: Not on file    Attends meetings of clubs or organizations: Not on file    Relationship status: Not on file  Other Topics Concern  . Not on file  Social History Narrative  . Not on file   Review of Systems - See HPI.  All other ROS are negative.  BP 112/81   Pulse 82   Temp 99 F (37.2 C) (Oral)   Resp 16   Ht 5\' 7"  (1.702 m)   Wt 239 lb 2 oz (108.5 kg)   SpO2 98%   BMI 37.45 kg/m   Physical Exam  Constitutional: She appears well-developed and well-nourished.  HENT:  Head: Normocephalic and atraumatic.  Cardiovascular: Normal rate, regular rhythm, normal heart sounds and intact distal pulses.  Pulmonary/Chest: Effort normal.  Psychiatric: She has a normal mood and affect.  Vitals reviewed.  Assessment/Plan: Depression Improving. Will continue Sertraline at current dose. She  will call if symptoms are not continuing to improve over the next few weeks. We can then consider increase to 100 mg daily.    Piedad ClimesWilliam Cody Noelly Lasseigne, PA-C

## 2018-03-23 ENCOUNTER — Encounter: Payer: Self-pay | Admitting: Physician Assistant

## 2018-03-23 DIAGNOSIS — G47419 Narcolepsy without cataplexy: Secondary | ICD-10-CM

## 2018-03-24 NOTE — Addendum Note (Signed)
Addended by: Waldon MerlMARTIN, Gregorio Worley C on: 03/24/2018 01:08 PM   Modules accepted: Orders

## 2018-05-03 ENCOUNTER — Other Ambulatory Visit: Payer: Self-pay | Admitting: Physician Assistant

## 2018-05-03 DIAGNOSIS — F32A Depression, unspecified: Secondary | ICD-10-CM

## 2018-05-03 DIAGNOSIS — F329 Major depressive disorder, single episode, unspecified: Secondary | ICD-10-CM

## 2018-05-17 ENCOUNTER — Encounter: Payer: Self-pay | Admitting: Neurology

## 2018-05-17 ENCOUNTER — Institutional Professional Consult (permissible substitution): Payer: BC Managed Care – PPO | Admitting: Neurology

## 2018-05-17 ENCOUNTER — Telehealth: Payer: Self-pay

## 2018-05-17 NOTE — Telephone Encounter (Signed)
Pt did not show for their appt with Dr. Athar today.  

## 2018-06-01 ENCOUNTER — Other Ambulatory Visit: Payer: Self-pay | Admitting: Physician Assistant

## 2018-06-01 DIAGNOSIS — F329 Major depressive disorder, single episode, unspecified: Secondary | ICD-10-CM

## 2018-06-01 DIAGNOSIS — F32A Depression, unspecified: Secondary | ICD-10-CM

## 2018-07-19 ENCOUNTER — Encounter: Payer: Self-pay | Admitting: Physician Assistant

## 2018-07-20 MED ORDER — CLOTRIMAZOLE-BETAMETHASONE 1-0.05 % EX CREA: 1.0000 "application " | TOPICAL_CREAM | Freq: Two times a day (BID) | CUTANEOUS | 0 refills | Status: DC

## 2018-08-30 ENCOUNTER — Encounter: Payer: Self-pay | Admitting: Physician Assistant

## 2018-08-30 DIAGNOSIS — L700 Acne vulgaris: Secondary | ICD-10-CM

## 2018-08-31 MED ORDER — CLINDAMYCIN PHOS-BENZOYL PEROX 1-5 % EX GEL: Freq: Two times a day (BID) | CUTANEOUS | 0 refills | Status: DC

## 2018-09-02 MED ORDER — CLOTRIMAZOLE-BETAMETHASONE 1-0.05 % EX CREA: 1.0000 "application " | TOPICAL_CREAM | Freq: Two times a day (BID) | CUTANEOUS | 0 refills | Status: DC

## 2018-09-02 NOTE — Addendum Note (Signed)
Addended by: Waldon Merl on: 09/02/2018 01:38 PM   Modules accepted: Orders

## 2018-10-21 ENCOUNTER — Telehealth: Payer: Self-pay | Admitting: Emergency Medicine

## 2018-10-21 NOTE — Telephone Encounter (Signed)
Spoke with patient about scheduling an appointment for depression. Patient states she has not been taking the medication has been doing well. Recently started having some dizziness, maybe withdrawals from coming off the Sertraline or low vitamin d def.  Advised patient we can schedule a virtual visit to discuss medications. She will call back.

## 2018-11-24 ENCOUNTER — Encounter: Payer: Self-pay | Admitting: Physician Assistant

## 2018-11-24 NOTE — Telephone Encounter (Signed)
Has not been seen in almost a year. Will need appointment.

## 2018-12-28 ENCOUNTER — Other Ambulatory Visit: Payer: Self-pay | Admitting: Physician Assistant

## 2018-12-28 DIAGNOSIS — F329 Major depressive disorder, single episode, unspecified: Secondary | ICD-10-CM

## 2018-12-28 DIAGNOSIS — F32A Depression, unspecified: Secondary | ICD-10-CM

## 2019-01-28 ENCOUNTER — Other Ambulatory Visit: Payer: Self-pay | Admitting: Physician Assistant

## 2019-01-28 ENCOUNTER — Encounter: Payer: Self-pay | Admitting: Emergency Medicine

## 2019-01-28 DIAGNOSIS — F329 Major depressive disorder, single episode, unspecified: Secondary | ICD-10-CM

## 2019-01-28 DIAGNOSIS — F32A Depression, unspecified: Secondary | ICD-10-CM

## 2019-01-28 NOTE — Telephone Encounter (Signed)
My chart message sent to patient advising she is due for an follow up appointment.

## 2019-03-22 ENCOUNTER — Encounter: Payer: Self-pay | Admitting: Physician Assistant

## 2019-03-28 ENCOUNTER — Encounter: Payer: Self-pay | Admitting: Physician Assistant

## 2019-03-28 ENCOUNTER — Other Ambulatory Visit: Payer: Self-pay

## 2019-03-28 ENCOUNTER — Ambulatory Visit (INDEPENDENT_AMBULATORY_CARE_PROVIDER_SITE_OTHER): Payer: BC Managed Care – PPO | Admitting: Physician Assistant

## 2019-03-28 DIAGNOSIS — R4584 Anhedonia: Secondary | ICD-10-CM

## 2019-03-28 MED ORDER — FLUOXETINE HCL 20 MG PO TABS: ORAL_TABLET | ORAL | 3 refills | Status: DC

## 2019-03-28 NOTE — Progress Notes (Signed)
   Virtual Visit via Video   I connected with patient on 03/28/19 at  4:00 PM EDT by a video enabled telemedicine application and verified that I am speaking with the correct person using two identifiers.  Location patient: Home Location provider: [facility], Office Persons participating in the virtual visit: Patient, Provider, [place] (Patina Moore)  I discussed the limitations of evaluation and management by telemedicine and the availability of in person appointments. The patient expressed understanding and agreed to proceed.  Subjective:   HPI:   Patient presents via Doxy.Me today to follow-up on anxiety and depression. Endorses mood is ok overall but noting significant anhedonia. Notes fatigue and change to interest in things. Appetite is stable. Notes sleep is very sporadic.  Notes going to sleep late night but has no issue falling asleep. Denies SI/HI.  ROS:   See pertinent positives and negatives per HPI.  Patient Active Problem List   Diagnosis Date Noted  . Acne vulgaris 01/07/2018  . Depression 01/07/2018  . Cholelithiasis 05/29/2014  . Visit for preventive health examination 04/14/2014  . Breast cancer screening 04/14/2014    Social History   Tobacco Use  . Smoking status: Never Smoker  . Smokeless tobacco: Never Used  Substance Use Topics  . Alcohol use: No    Current Outpatient Medications:  .  FLUoxetine (PROZAC) 20 MG tablet, Take 1/2 tablet x 5 days. Then take 1 tablet daily., Disp: 30 tablet, Rfl: 3  No Known Allergies  Objective:   There were no vitals taken for this visit.  Patient is well-developed, well-nourished in no acute distress.  Resting comfortably at home.  Head is normocephalic, atraumatic.  No labored breathing.  Speech is clear and coherent with logical content.  Patient is alert and oriented at baseline.   Assessment and Plan:   1. Anhedonia More so than depressed mood. No insomnia, patient needs to work on a sleep  schedule. Start Fluoxetine 10 mg QD x 1 week, then increase to 20 mg daily. Follow-up 4 weeks in office.     [name], PA-C 03/28/2019

## 2019-03-28 NOTE — Progress Notes (Signed)
I have discussed the procedure for the virtual visit with the patient who has given consent to proceed with assessment and treatment.   Rosy Estabrook S Krystie Leiter, CMA     

## 2019-03-30 ENCOUNTER — Encounter: Payer: Self-pay | Admitting: Physician Assistant

## 2019-03-30 MED ORDER — NYSTATIN 100000 UNIT/ML MT SUSP: 5.0000 mL | Freq: Four times a day (QID) | OROMUCOSAL | 0 refills | Status: DC

## 2019-04-06 ENCOUNTER — Other Ambulatory Visit: Payer: Self-pay | Admitting: Obstetrics and Gynecology

## 2019-04-06 DIAGNOSIS — R928 Other abnormal and inconclusive findings on diagnostic imaging of breast: Secondary | ICD-10-CM

## 2019-04-08 ENCOUNTER — Other Ambulatory Visit: Payer: BC Managed Care – PPO

## 2019-04-08 ENCOUNTER — Ambulatory Visit
Admission: RE | Admit: 2019-04-08 | Discharge: 2019-04-08 | Disposition: A | Discharge status: Home / Self Care | Payer: BC Managed Care – PPO | Source: Ambulatory Visit | Attending: Obstetrics and Gynecology | Admitting: Obstetrics and Gynecology

## 2019-04-08 ENCOUNTER — Ambulatory Visit: Payer: BC Managed Care – PPO

## 2019-04-08 ENCOUNTER — Other Ambulatory Visit: Payer: Self-pay

## 2019-04-08 DIAGNOSIS — R928 Other abnormal and inconclusive findings on diagnostic imaging of breast: Secondary | ICD-10-CM

## 2019-06-30 ENCOUNTER — Encounter: Payer: Self-pay | Admitting: Physician Assistant

## 2019-07-27 ENCOUNTER — Encounter: Payer: Self-pay | Admitting: Emergency Medicine

## 2019-07-27 ENCOUNTER — Other Ambulatory Visit: Payer: Self-pay | Admitting: Emergency Medicine

## 2019-07-27 DIAGNOSIS — F329 Major depressive disorder, single episode, unspecified: Secondary | ICD-10-CM

## 2019-07-27 DIAGNOSIS — F32A Depression, unspecified: Secondary | ICD-10-CM

## 2019-07-27 MED ORDER — FLUOXETINE HCL 20 MG PO TABS: 20.0000 mg | ORAL_TABLET | Freq: Every day | ORAL | 0 refills | Status: DC

## 2019-07-29 ENCOUNTER — Other Ambulatory Visit: Payer: Self-pay

## 2019-07-29 ENCOUNTER — Ambulatory Visit (INDEPENDENT_AMBULATORY_CARE_PROVIDER_SITE_OTHER): Payer: BC Managed Care – PPO | Admitting: Physician Assistant

## 2019-07-29 ENCOUNTER — Encounter: Payer: Self-pay | Admitting: Physician Assistant

## 2019-07-29 DIAGNOSIS — F329 Major depressive disorder, single episode, unspecified: Secondary | ICD-10-CM | POA: Diagnosis not present

## 2019-07-29 DIAGNOSIS — F32A Depression, unspecified: Secondary | ICD-10-CM

## 2019-07-29 NOTE — Progress Notes (Signed)
   Virtual Visit via Video   I connected with patient on 07/29/19 at  1:30 PM EST by a video enabled telemedicine application and verified that I am speaking with the correct person using two identifiers.  Location patient: Home Location provider: [facility], Office Persons participating in the virtual visit: Patient, Provider, CMA (Patina Moore)  I discussed the limitations of evaluation and management by telemedicine and the availability of in person appointments. The patient expressed understanding and agreed to proceed.  Subjective:   HPI:   Patient presents via Doxy.Me for follow-up of depression. Patient is currently on a regimen of Fluoxetine 20 mg once daily. Endorses taking as directed.  Notes medication does help with her level of anxiety but depression is still an issue.  Has noted potential side effect of medicine --noting feels more impulsive with this medicine.  Denies any manic behavior.  Denies suicidal thought or ideation.  Notes she has been on several other medications without success as well, which is frustrating for her..     ROS:   See pertinent positives and negatives per HPI.  Patient Active Problem List   Diagnosis Date Noted  . Acne vulgaris 01/07/2018  . Depression 01/07/2018  . Cholelithiasis 05/29/2014  . Visit for preventive health examination 04/14/2014  . Breast cancer screening 04/14/2014    Social History   Tobacco Use  . Smoking status: Never Smoker  . Smokeless tobacco: Never Used  Substance Use Topics  . Alcohol use: No    Current Outpatient Medications:  .  Ascorbic Acid (VITAMIN C) 1000 MG tablet, Take 1,000 mg by mouth daily., Disp: , Rfl:  .  cetirizine (ZYRTEC) 10 MG tablet, Take 10 mg by mouth daily., Disp: , Rfl:  .  FLUoxetine (PROZAC) 20 MG tablet, Take 1 tablet (20 mg total) by mouth daily. Please schedule a follow up for refills, Disp: 30 tablet, Rfl: 0  No Known Allergies  Objective:   There were no vitals  taken for this visit.  Patient is well-developed, well-nourished in no acute distress.  Resting comfortably  at home.  Head is normocephalic, atraumatic.  No labored breathing.  Speech is clear and coherent with logical content.  Patient is alert and oriented at baseline.   Assessment and Plan:   1. Depression, unspecified depression type Failed several medications including multiple SSRIs and Wellbutrin.  Discussed potential change to SNRI or tricyclic antidepressant.  GeneSight testing also offered.  Patient elects to proceed with gene site testing.  We will continue her current fluoxetine dose until we can have this performed.  She has appointment scheduled for Monday to come in for test.  Will call with results once available so that further management can be done.    Piedad Climes, PA-C 07/29/2019

## 2019-07-29 NOTE — Patient Instructions (Signed)
Instructions sent to MyChart  Please continue current dose of fluoxetine. We will see you Monday in office to perform the GeneSight testing. Once I have results we will call you to discuss in detail and determine next steps in medication management.  Try to keep a well-balanced diet and keep physically active as these things can help with mood as well.  Hang in there!

## 2019-07-29 NOTE — Progress Notes (Signed)
I have discussed the procedure for the virtual visit with the patient who has given consent to proceed with assessment and treatment.   Roneisha Stern S Mattheo Swindle, CMA     

## 2019-08-01 ENCOUNTER — Ambulatory Visit: Payer: BC Managed Care – PPO

## 2019-08-01 ENCOUNTER — Other Ambulatory Visit: Payer: Self-pay

## 2019-08-01 ENCOUNTER — Encounter: Payer: Self-pay | Admitting: Physician Assistant

## 2019-08-01 DIAGNOSIS — F32A Depression, unspecified: Secondary | ICD-10-CM

## 2019-08-01 DIAGNOSIS — F329 Major depressive disorder, single episode, unspecified: Secondary | ICD-10-CM

## 2019-08-01 NOTE — Progress Notes (Signed)
[name], 44 year old female, presents to the office to complete the Genesight buccal swab. Paperwork was completed and signed by the patient. Patient instructed on correct way to collect sample. Samples were collected from both cheeks and placed in the swab sample envelope. Patient left the office in good condition and an  order  was entered in the [email or web address] portal for pick up with [facility] for 08/02/19 between 12:15 and 1:15.

## 2019-08-02 ENCOUNTER — Encounter: Payer: Self-pay | Admitting: Physician Assistant

## 2019-08-04 ENCOUNTER — Encounter: Payer: Self-pay | Admitting: Physician Assistant

## 2019-08-10 ENCOUNTER — Telehealth: Payer: Self-pay | Admitting: Physician Assistant

## 2019-08-10 NOTE — Telephone Encounter (Signed)
Patient has been scheduled

## 2019-08-10 NOTE — Telephone Encounter (Signed)
Received her gene site test results. Will be reviewing today. Would like to have video visit later in the week to discuss results and next steps.

## 2019-08-10 NOTE — Telephone Encounter (Signed)
Left a detailed message on patient VM to schedule a video visit to discuss gene testing results

## 2019-08-12 ENCOUNTER — Encounter: Payer: Self-pay | Admitting: Physician Assistant

## 2019-08-12 ENCOUNTER — Ambulatory Visit (INDEPENDENT_AMBULATORY_CARE_PROVIDER_SITE_OTHER): Payer: BC Managed Care – PPO | Admitting: Physician Assistant

## 2019-08-12 ENCOUNTER — Other Ambulatory Visit: Payer: Self-pay

## 2019-08-12 DIAGNOSIS — F329 Major depressive disorder, single episode, unspecified: Secondary | ICD-10-CM

## 2019-08-12 DIAGNOSIS — F32A Depression, unspecified: Secondary | ICD-10-CM

## 2019-08-12 MED ORDER — L-METHYLFOLATE 15 MG PO TABS: 1.0000 | ORAL_TABLET | Freq: Every day | ORAL | 2 refills | Status: DC

## 2019-08-12 NOTE — Patient Instructions (Addendum)
Instructions sent to MyChart.  Please start the l-methylfolate supplement once daily as directed. I have sent a prescription into your pharmacy. I want you to cut the fluoxetine (Prozac) in half and take 1/2 tablet daily for 1 week, then 1 tablet every other day for 1 week before completely stopping.  Follow-up with me in 3 to 4 weeks so we can reassess things and make further adjustments. Follow-up sooner if needed.  Again, let me know if you did not get a copy of your GeneSight results so that I can print out a copy for you to pick up.  Take care! Tell [facility] I said congratulations!

## 2019-08-12 NOTE — Progress Notes (Signed)
   Virtual Visit via Video   I connected with patient on 08/12/19 at 10:30 AM EST by a video enabled telemedicine application and verified that I am speaking with the correct person using two identifiers.  Location patient: Home Location provider: [facility], Office Persons participating in the virtual visit: Patient, Provider, CMA (Patina Moore)  I discussed the limitations of evaluation and management by telemedicine and the availability of in person appointments. The patient expressed understanding and agreed to proceed.  Subjective:   HPI:   Patient presents via doxy.need today for follow-up of depression and discuss her GeneSight test results.  Patient with longstanding history of depression, previously resistant to pharmacotherapy.  Patient has been on multiple antidepressants including citalopram, escitalopram, bupropion, sertraline and fluoxetine.  Discussed at last visit switching to an SNRI versus genetic testing.  She opted to proceed with gene site test.  ROS:   See pertinent positives and negatives per HPI.  Patient Active Problem List   Diagnosis Date Noted  . Acne vulgaris 01/07/2018  . Depression 01/07/2018  . Cholelithiasis 05/29/2014  . Visit for preventive health examination 04/14/2014  . Breast cancer screening 04/14/2014    Social History   Tobacco Use  . Smoking status: Never Smoker  . Smokeless tobacco: Never Used  Substance Use Topics  . Alcohol use: No    Current Outpatient Medications:  .  Ascorbic Acid (VITAMIN C) 1000 MG tablet, Take 1,000 mg by mouth daily., Disp: , Rfl:  .  cetirizine (ZYRTEC) 10 MG tablet, Take 10 mg by mouth daily., Disp: , Rfl:  .  FLUoxetine (PROZAC) 20 MG tablet, Take 1 tablet (20 mg total) by mouth daily. Please schedule a follow up for refills, Disp: 30 tablet, Rfl: 0  No Known Allergies  Objective:   There were no vitals taken for this visit.  Patient is well-developed, well-nourished in no acute  distress.  Resting comfortably at home.  Head is normocephalic, atraumatic.  No labored breathing.  Speech is clear and coherent with logical content.  Patient is alert and oriented at baseline.   Assessment and Plan:   1. Depression, unspecified depression type Reviewed GeneSight test results with patient.  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  We will start her on l-methylfolate supplement.  In terms of medication considered most appropriate for her her test, this includes does venlafaxine, levomilnacipran, selegiline and Vilazodone.  After discussion of medication she would like to start with just the l-methylfolate supplementation and weaning down from fluoxetine.  Feel this is very reasonable for her at present time in the absence of severe symptoms or alarm signs or symptoms.  Will have close follow-up within the month.    Piedad Climes, [place] 08/12/2019

## 2019-08-12 NOTE — Progress Notes (Signed)
I have discussed the procedure for the virtual visit with the patient who has given consent to proceed with assessment and treatment.   [name] S Esiquio Boesen, CMA     

## 2019-08-29 ENCOUNTER — Other Ambulatory Visit: Payer: Self-pay | Admitting: Physician Assistant

## 2019-08-29 DIAGNOSIS — F32A Depression, unspecified: Secondary | ICD-10-CM

## 2019-08-29 DIAGNOSIS — F329 Major depressive disorder, single episode, unspecified: Secondary | ICD-10-CM

## 2019-08-29 MED ORDER — FLUOXETINE HCL 20 MG PO TABS: 20.0000 mg | ORAL_TABLET | Freq: Every day | ORAL | 1 refills | Status: DC

## 2019-08-30 ENCOUNTER — Encounter: Payer: Self-pay | Admitting: Physician Assistant

## 2019-09-01 MED ORDER — DESVENLAFAXINE SUCCINATE ER 25 MG PO TB24: 1.0000 | ORAL_TABLET | Freq: Every day | ORAL | 1 refills | Status: DC

## 2019-09-01 NOTE — Addendum Note (Signed)
Addended by: Waldon Merl on: 09/01/2019 08:13 AM   Modules accepted: Orders

## 2019-09-14 ENCOUNTER — Encounter: Payer: Self-pay | Admitting: Physician Assistant

## 2019-09-25 ENCOUNTER — Encounter: Payer: Self-pay | Admitting: Physician Assistant

## 2019-09-26 MED ORDER — DESVENLAFAXINE ER 50 MG PO TB24: 50.0000 mg | ORAL_TABLET | Freq: Every day | ORAL | 2 refills | Status: DC

## 2019-10-01 ENCOUNTER — Ambulatory Visit: Payer: BC Managed Care – PPO

## 2019-10-31 ENCOUNTER — Encounter: Payer: Self-pay | Admitting: Physician Assistant

## 2019-11-09 ENCOUNTER — Telehealth: Payer: BC Managed Care – PPO | Admitting: Physician Assistant

## 2019-11-09 ENCOUNTER — Encounter: Payer: Self-pay | Admitting: Physician Assistant

## 2019-11-09 ENCOUNTER — Ambulatory Visit (INDEPENDENT_AMBULATORY_CARE_PROVIDER_SITE_OTHER): Payer: BC Managed Care – PPO | Admitting: Emergency Medicine

## 2019-11-09 ENCOUNTER — Other Ambulatory Visit: Payer: Self-pay

## 2019-11-09 DIAGNOSIS — R1084 Generalized abdominal pain: Secondary | ICD-10-CM

## 2019-11-09 DIAGNOSIS — R1011 Right upper quadrant pain: Secondary | ICD-10-CM

## 2019-11-09 LAB — POCT URINALYSIS DIPSTICK
Bilirubin, UA: NEGATIVE
Glucose, UA: NEGATIVE
Ketones, UA: NEGATIVE
Leukocytes, UA: NEGATIVE
Nitrite, UA: NEGATIVE
Protein, UA: NEGATIVE
Spec Grav, UA: 1.01 (ref 1.010–1.025)
Urobilinogen, UA: 0.2 E.U./dL
pH, UA: 6.5 (ref 5.0–8.0)

## 2019-11-09 LAB — CBC WITH DIFFERENTIAL/PLATELET
Basophils Absolute: 0 10*3/uL (ref 0.0–0.1)
Basophils Relative: 0.3 % (ref 0.0–3.0)
Eosinophils Absolute: 0 10*3/uL (ref 0.0–0.7)
Eosinophils Relative: 0.4 % (ref 0.0–5.0)
HCT: 40.7 % (ref 36.0–46.0)
Hemoglobin: 13.7 g/dL (ref 12.0–15.0)
Lymphocytes Relative: 24.5 % (ref 12.0–46.0)
Lymphs Abs: 2.4 10*3/uL (ref 0.7–4.0)
MCHC: 33.6 g/dL (ref 30.0–36.0)
MCV: 88.3 fl (ref 78.0–100.0)
Monocytes Absolute: 0.3 10*3/uL (ref 0.1–1.0)
Monocytes Relative: 3.5 % (ref 3.0–12.0)
Neutro Abs: 7 10*3/uL (ref 1.4–7.7)
Neutrophils Relative %: 71.3 % (ref 43.0–77.0)
Platelets: 362 10*3/uL (ref 150.0–400.0)
RBC: 4.61 Mil/uL (ref 3.87–5.11)
RDW: 14 % (ref 11.5–15.5)
WBC: 9.8 10*3/uL (ref 4.0–10.5)

## 2019-11-09 LAB — COMPREHENSIVE METABOLIC PANEL
ALT: 10 U/L (ref 0–35)
AST: 14 U/L (ref 0–37)
Albumin: 4.3 g/dL (ref 3.5–5.2)
Alkaline Phosphatase: 93 U/L (ref 39–117)
BUN: 8 mg/dL (ref 6–23)
CO2: 25 mEq/L (ref 19–32)
Calcium: 9.1 mg/dL (ref 8.4–10.5)
Chloride: 104 mEq/L (ref 96–112)
Creatinine, Ser: 0.72 mg/dL (ref 0.40–1.20)
GFR: 105.69 mL/min (ref >60.00)
Glucose, Bld: 99 mg/dL (ref 70–99)
Potassium: 3.9 mEq/L (ref 3.5–5.1)
Sodium: 138 mEq/L (ref 135–145)
Total Bilirubin: 0.4 mg/dL (ref 0.2–1.2)
Total Protein: 7.2 g/dL (ref 6.0–8.3)

## 2019-11-09 MED ORDER — ONDANSETRON HCL 4 MG PO TABS: 4.0000 mg | ORAL_TABLET | Freq: Three times a day (TID) | ORAL | 0 refills | Status: DC | PRN

## 2019-11-09 NOTE — Progress Notes (Signed)
Virtual Visit via Video   I connected with patient on 11/09/19 at 11:00 AM EDT by a video enabled telemedicine application and verified that I am speaking with the correct person using two identifiers.  Location patient: Home Location provider: [facility], Office Persons participating in the virtual visit: Patient, Provider, CMA (Patina Moore)  I discussed the limitations of evaluation and management by telemedicine and the availability of in person appointments. The patient expressed understanding and agreed to proceed.  Subjective:   HPI:   Patient presents via [facility] today complaining of 2-1/2 days of episodic right upper abdominal and side pain associated with intermittent bouts of nonbloody emesis.  Denies any trauma or injury.  Denies heavy lifting.  Pain is described as aching in nature, again intermittent, not exacerbated or alleviated with with food or range of motion.  Denies radiation of pain elsewhere.  Denies change in bowel or bladder habits.  Denies fever or chills.  Denies any recent travel or sick contact.  Patient states she has been able to keep fluids in overall, only having a couple of episodes of vomiting per day.  Has not had anything to eat yet today but yesterday had toast throughout the day and was able to keep it down.  Denies any heartburn or indigestion.  Patient is status post cholecystectomy.  ROS:   See pertinent positives and negatives per HPI.  Patient Active Problem List   Diagnosis Date Noted  . Acne vulgaris 01/07/2018  . Depression 01/07/2018  . Cholelithiasis 05/29/2014  . Visit for preventive health examination 04/14/2014  . Breast cancer screening 04/14/2014    Social History   Tobacco Use  . Smoking status: Never Smoker  . Smokeless tobacco: Never Used  Substance Use Topics  . Alcohol use: No    Current Outpatient Medications:  .  Ascorbic Acid (VITAMIN C) 1000 MG tablet, Take 1,000 mg by mouth daily., Disp: , Rfl:  .   cetirizine (ZYRTEC) 10 MG tablet, Take 10 mg by mouth daily., Disp: , Rfl:  .  Desvenlafaxine ER (PRISTIQ) 50 MG TB24, Take 1 tablet (50 mg total) by mouth daily., Disp: 30 tablet, Rfl: 2 .  Desvenlafaxine Succinate ER 25 MG TB24, Take 25 mg by mouth daily., Disp: 30 tablet, Rfl: 1 .  L-Methylfolate 15 MG TABS, Take 1 tablet (15 mg total) by mouth daily., Disp: 30 tablet, Rfl: 2  No Known Allergies  Objective:   There were no vitals taken for this visit.  Patient is well-developed, well-nourished in no acute distress.  Resting comfortably at home.  Head is normocephalic, atraumatic.  No labored breathing.  Speech is clear and coherent with logical content.  Patient is alert and oriented at baseline.   Assessment and Plan:   1. RUQ pain Status post cholecystectomy.  Afebrile and without diarrhea.  Intermittent episodes of emesis not associated with meals.  Will bring her into check UA, CBC and CMP.  When she is here today I will perform an abdominal examination to make sure no concern for acute abdomen.  Rx Zofran to help with nausea.  Tylenol if needed for pain.  Avoid NSAIDs.  Avoid alcohol or heavy foods.  Continue brat diet.  Strict ER precautions reviewed with patient.  Addendum: Patient presented to the lab at 1 PM as scheduled.  Afebrile.  Nontoxic appearing.  Pain is localized to the right upper quadrant however is not reproducible with range of motion or palpation of the abdomen.  UA without sign  of infection.  Labs obtained.  We will have her continue care as discussed during time of video until we get lab results in.  Based on lab results this can help [place] determine next steps.  Again strict ER precautions reviewed with patient.  Patient voiced understanding and agreement with plan.    [name], PA-C 11/09/2019

## 2019-11-09 NOTE — Progress Notes (Signed)
I have discussed the procedure for the virtual visit with the patient who has given consent to proceed with assessment and treatment. Patient unable to get any vital signs.  Ambyr Qadri N Tavares Levinson, LPN     

## 2019-11-10 ENCOUNTER — Encounter (HOSPITAL_COMMUNITY): Payer: Self-pay | Admitting: *Deleted

## 2019-11-10 ENCOUNTER — Encounter: Payer: Self-pay | Admitting: Physician Assistant

## 2019-11-10 ENCOUNTER — Other Ambulatory Visit: Payer: Self-pay | Admitting: Physician Assistant

## 2019-11-10 ENCOUNTER — Other Ambulatory Visit: Payer: Self-pay

## 2019-11-10 ENCOUNTER — Emergency Department (HOSPITAL_COMMUNITY)
Admission: EM | Admit: 2019-11-10 | Discharge: 2019-11-11 | Disposition: A | Discharge status: Home / Self Care | Payer: BC Managed Care – PPO | Attending: Emergency Medicine | Admitting: Emergency Medicine

## 2019-11-10 DIAGNOSIS — N83201 Unspecified ovarian cyst, right side: Secondary | ICD-10-CM | POA: Diagnosis not present

## 2019-11-10 DIAGNOSIS — N83209 Unspecified ovarian cyst, unspecified side: Secondary | ICD-10-CM

## 2019-11-10 DIAGNOSIS — Z9049 Acquired absence of other specified parts of digestive tract: Secondary | ICD-10-CM | POA: Insufficient documentation

## 2019-11-10 DIAGNOSIS — Z79899 Other long term (current) drug therapy: Secondary | ICD-10-CM | POA: Diagnosis not present

## 2019-11-10 DIAGNOSIS — N201 Calculus of ureter: Secondary | ICD-10-CM | POA: Diagnosis not present

## 2019-11-10 DIAGNOSIS — R112 Nausea with vomiting, unspecified: Secondary | ICD-10-CM

## 2019-11-10 DIAGNOSIS — R109 Unspecified abdominal pain: Secondary | ICD-10-CM | POA: Diagnosis present

## 2019-11-10 LAB — CBC
HCT: 42.7 % (ref 36.0–46.0)
Hemoglobin: 13.7 g/dL (ref 12.0–15.0)
MCH: 28.8 pg (ref 26.0–34.0)
MCHC: 32.1 g/dL (ref 30.0–36.0)
MCV: 89.9 fL (ref 80.0–100.0)
Platelets: 359 10*3/uL (ref 150–400)
RBC: 4.75 MIL/uL (ref 3.87–5.11)
RDW: 12.8 % (ref 11.5–15.5)
WBC: 13.3 10*3/uL — ABNORMAL HIGH (ref 4.0–10.5)
nRBC: 0 % (ref 0.0–0.2)

## 2019-11-10 LAB — COMPREHENSIVE METABOLIC PANEL
ALT: 29 U/L (ref 0–44)
AST: 81 U/L — ABNORMAL HIGH (ref 15–41)
Albumin: 4 g/dL (ref 3.5–5.0)
Alkaline Phosphatase: 82 U/L (ref 38–126)
Anion gap: 14 (ref 5–15)
BUN: 11 mg/dL (ref 6–20)
CO2: 20 mmol/L — ABNORMAL LOW (ref 22–32)
Calcium: 9 mg/dL (ref 8.9–10.3)
Chloride: 106 mmol/L (ref 98–111)
Creatinine, Ser: 1.04 mg/dL — ABNORMAL HIGH (ref 0.44–1.00)
GFR calc Af Amer: >60 mL/min (ref >60)
GFR calc non Af Amer: >60 mL/min (ref >60)
Glucose, Bld: 114 mg/dL — ABNORMAL HIGH (ref 70–99)
Potassium: 3.5 mmol/L (ref 3.5–5.1)
Sodium: 140 mmol/L (ref 135–145)
Total Bilirubin: 0.5 mg/dL (ref 0.3–1.2)
Total Protein: 7.7 g/dL (ref 6.5–8.1)

## 2019-11-10 LAB — I-STAT BETA HCG BLOOD, ED (MC, WL, AP ONLY): I-stat hCG, quantitative: <5 m[IU]/mL (ref <5)

## 2019-11-10 LAB — LIPASE, BLOOD: Lipase: 20 U/L (ref 11–51)

## 2019-11-10 MED ORDER — OXYCODONE-ACETAMINOPHEN 5-325 MG PO TABS
1.0000 | ORAL_TABLET | ORAL | Status: DC | PRN
  Administered 2019-11-10: 1 via ORAL
  Filled 2019-11-10: qty 1

## 2019-11-10 MED ORDER — SODIUM CHLORIDE 0.9% FLUSH
3.0000 mL | Freq: Once | INTRAVENOUS | Status: AC
  Administered 2019-11-11: 3 mL via INTRAVENOUS

## 2019-11-10 MED ORDER — HYOSCYAMINE SULFATE 0.125 MG PO TABS: 0.1250 mg | ORAL_TABLET | ORAL | 0 refills | Status: DC | PRN

## 2019-11-10 NOTE — ED Triage Notes (Signed)
To ED for eval of RLQ pain that started yesterday. Seen by PCP without dx. Vomiting. Pt rocking forward and back with pain. Diarrhea today. Denies difficulty with urination. States she has never felt this pain in the past. LMP a few weeks ago and normal.

## 2019-11-11 ENCOUNTER — Other Ambulatory Visit: Payer: Self-pay

## 2019-11-11 ENCOUNTER — Emergency Department (HOSPITAL_COMMUNITY): Payer: BC Managed Care – PPO

## 2019-11-11 LAB — URINALYSIS, ROUTINE W REFLEX MICROSCOPIC
Bilirubin Urine: NEGATIVE
Glucose, UA: NEGATIVE mg/dL
Ketones, ur: NEGATIVE mg/dL
Leukocytes,Ua: NEGATIVE
Nitrite: NEGATIVE
Protein, ur: 30 mg/dL — AB
RBC / HPF: >50 RBC/hpf — ABNORMAL HIGH (ref 0–5)
Specific Gravity, Urine: 1.027 (ref 1.005–1.030)
pH: 5 (ref 5.0–8.0)

## 2019-11-11 MED ORDER — SODIUM CHLORIDE 0.9 % IV BOLUS
1000.0000 mL | Freq: Once | INTRAVENOUS | Status: AC
  Administered 2019-11-11: 1000 mL via INTRAVENOUS

## 2019-11-11 MED ORDER — ONDANSETRON 4 MG PO TBDP
4.0000 mg | ORAL_TABLET | Freq: Three times a day (TID) | ORAL | 0 refills | Status: DC | PRN
Start: 2019-11-11 — End: 2019-12-30

## 2019-11-11 MED ORDER — IOHEXOL 300 MG/ML  SOLN
125.0000 mL | Freq: Once | INTRAMUSCULAR | Status: AC | PRN
  Administered 2019-11-11: 125 mL via INTRAVENOUS

## 2019-11-11 MED ORDER — TAMSULOSIN HCL 0.4 MG PO CAPS
0.4000 mg | ORAL_CAPSULE | Freq: Every day | ORAL | 0 refills | Status: DC
Start: 2019-11-11 — End: 2019-12-30

## 2019-11-11 MED ORDER — HYDROMORPHONE HCL 1 MG/ML IJ SOLN
0.5000 mg | Freq: Once | INTRAMUSCULAR | Status: AC
  Administered 2019-11-11: 0.5 mg via INTRAVENOUS
  Filled 2019-11-11: qty 1

## 2019-11-11 MED ORDER — KETOROLAC TROMETHAMINE 30 MG/ML IJ SOLN
30.0000 mg | Freq: Once | INTRAMUSCULAR | Status: AC
  Administered 2019-11-11: 30 mg via INTRAVENOUS
  Filled 2019-11-11: qty 1

## 2019-11-11 MED ORDER — MORPHINE SULFATE (PF) 4 MG/ML IV SOLN
4.0000 mg | Freq: Once | INTRAVENOUS | Status: AC
  Administered 2019-11-11: 4 mg via INTRAVENOUS
  Filled 2019-11-11: qty 1

## 2019-11-11 MED ORDER — ONDANSETRON HCL 4 MG/2ML IJ SOLN
4.0000 mg | Freq: Once | INTRAMUSCULAR | Status: AC
  Administered 2019-11-11: 4 mg via INTRAVENOUS
  Filled 2019-11-11: qty 2

## 2019-11-11 MED ORDER — OXYCODONE-ACETAMINOPHEN 5-325 MG PO TABS: 1.0000 | ORAL_TABLET | ORAL | 0 refills | Status: DC | PRN

## 2019-11-11 NOTE — Telephone Encounter (Signed)
Please call patient this morning to assess current symptoms -- severity, location (any changes?), or new symptoms. Per result note from yesterday she noted things were improving. We will likely need to proceed with imaging but want updated information from her to determine next best steps and make sure she does not need ER triage.

## 2019-11-11 NOTE — ED Provider Notes (Signed)
[facility] EMERGENCY DEPARTMENT Provider Note   CSN: [id number] Arrival date & time: 11/10/19  2008     History Chief Complaint  Patient presents with  . Abdominal Pain    [name] is a 44 y.o. female.  The history is provided by the patient and medical records.    44 year old female with history of acid reflux, anxiety, depression, gallstones status post cholecystectomy, presenting to the ED for right-sided abdominal pain.  States it started early Tuesday morning and has been worsening since that time.  He reports numerous episodes of nonbloody, nonbilious emesis over the past 2 days.  She saw her PCP yesterday and was told that everything seemed fine but they were waiting on some blood work.  She was sent home with Zofran and was given a Percocet in the office without change in her symptoms.  States now she is started having loose, watery diarrhea.  This is also nonbloody.  She is status post cholecystectomy along with C-section, no other abdominal surgeries.  States she feels very dehydrated at this time.  She has never had pain like this before.  She denies any urinary symptoms, pelvic pain, or vaginal discharge.  No irregular bleeding.  Past Medical History:  Diagnosis Date  . Acid indigestion    "sometimes"  . Anxiety   . Depression   . Gallstones   . Glaucoma   . Headache    "sometimes"    Patient Active Problem List   Diagnosis Date Noted  . Acne vulgaris 01/07/2018  . Depression 01/07/2018  . Cholelithiasis 05/29/2014  . Visit for preventive health examination 04/14/2014  . Breast cancer screening 04/14/2014    Past Surgical History:  Procedure Laterality Date  . CESAREAN SECTION    . CHOLECYSTECTOMY N/A 11/15/2015   Procedure: LAPAROSCOPIC CHOLECYSTECTOMY WITH INTRAOPERATIVE CHOLANGIOGRAM;  Surgeon: Gaynelle Adu, MD;  Location: [facility] OR;  Service: General;  Laterality: N/A;  . dental implant     March 2019  . FOOT SURGERY     Bilateral  . GALLBLADDER SURGERY     2016  . lasic    . TOOTH EXTRACTION    . WISDOM TOOTH EXTRACTION       OB History   No obstetric history on file.     Family History  Problem Relation Age of Onset  . Healthy Mother        Living  . Healthy Father        Living  . Breast cancer Maternal Grandmother   . Stroke Paternal Grandmother   . Diabetes Maternal Uncle   . Kidney disease Maternal Uncle   . Hyperlipidemia Paternal Uncle   . Hypertension Paternal Uncle   . Healthy Brother   . Healthy Sister   . Healthy Daughter        x2    Social History   Tobacco Use  . Smoking status: Never Smoker  . Smokeless tobacco: Never Used  Substance Use Topics  . Alcohol use: No  . Drug use: No    Home Medications Prior to Admission medications   Medication Sig Start Date End Date Taking? Authorizing Provider  Ascorbic Acid (VITAMIN C) 1000 MG tablet Take 1,000 mg by mouth daily.    [provider]  cetirizine (ZYRTEC) 10 MG tablet Take 10 mg by mouth daily.    [provider]  Desvenlafaxine ER (PRISTIQ) 50 MG TB24 Take 1 tablet (50 mg total) by mouth daily. 09/26/19   Waldon Merl,  PA-C  Desvenlafaxine Succinate ER 25 MG TB24 Take 25 mg by mouth daily. 09/01/19   Waldon Merl, PA-C  hyoscyamine (LEVSIN) 0.125 MG tablet Take 1 tablet (0.125 mg total) by mouth every 4 (four) hours as needed. 11/10/19   Waldon Merl, PA-C  L-Methylfolate 15 MG TABS Take 1 tablet (15 mg total) by mouth daily. 08/12/19   Waldon Merl, PA-C  ondansetron (ZOFRAN) 4 MG tablet Take 1 tablet (4 mg total) by mouth every 8 (eight) hours as needed for nausea or vomiting. 11/09/19   Waldon Merl, PA-C    Allergies    Patient has no known allergies.  Review of Systems   Review of Systems  Gastrointestinal: Positive for abdominal pain, diarrhea, nausea and vomiting.  All other systems reviewed and are negative.   Physical Exam Updated Vital Signs BP (!) 145/87    Pulse 84   Temp 98.1 F (36.7 C)   Resp 15   LMP 09/19/2019 (Exact Date)   SpO2 99%   Physical Exam Vitals and nursing note reviewed.  Constitutional:      Appearance: She is well-developed.     Comments: Appears uncomfortable and nauseated  HENT:     Head: Normocephalic and atraumatic.  Eyes:     Conjunctiva/sclera: Conjunctivae normal.     Pupils: Pupils are equal, round, and reactive to light.  Cardiovascular:     Rate and Rhythm: Normal rate and regular rhythm.     Heart sounds: Normal heart sounds.  Pulmonary:     Effort: Pulmonary effort is normal.     Breath sounds: Normal breath sounds.  Abdominal:     General: Bowel sounds are normal.     Palpations: Abdomen is soft.     Tenderness: There is abdominal tenderness in the right lower quadrant. There is no rebound.       Comments: Right-sided abdominal tenderness as depicted, normal bowel sounds  Musculoskeletal:        General: Normal range of motion.     Cervical back: Normal range of motion.  Skin:    General: Skin is warm and dry.  Neurological:     Mental Status: She is alert and oriented to person, place, and time.     ED Results / Procedures / Treatments   Labs (all labs ordered are listed, but only abnormal results are displayed) Labs Reviewed  COMPREHENSIVE METABOLIC PANEL - Abnormal; Notable for the following components:      Result Value   CO2 20 (*)    Glucose, Bld 114 (*)    Creatinine, Ser 1.04 (*)    AST 81 (*)    All other components within normal limits  CBC - Abnormal; Notable for the following components:   WBC 13.3 (*)    All other components within normal limits  URINALYSIS, ROUTINE W REFLEX MICROSCOPIC - Abnormal; Notable for the following components:   APPearance HAZY (*)    Hgb urine dipstick LARGE (*)    Protein, ur 30 (*)    RBC / HPF >50 (*)    Bacteria, UA RARE (*)    All other components within normal limits  LIPASE, BLOOD  I-STAT BETA HCG BLOOD, ED ([facility], [facility], [facility] ONLY)     EKG None  Radiology CT ABDOMEN PELVIS W CONTRAST  Result Date: 11/11/2019 CLINICAL DATA:  Quadrant pain for 3 days EXAM: CT ABDOMEN AND PELVIS WITH CONTRAST TECHNIQUE: Multidetector CT imaging of the abdomen and pelvis was performed using the  standard protocol following bolus administration of intravenous contrast. CONTRAST:  111mL OMNIPAQUE IOHEXOL 300 MG/ML  SOLN COMPARISON:  None. FINDINGS: Lower chest:  No contributory findings. Hepatobiliary: No focal liver abnormality.Cholecystectomy with unremarkable bile ducts. Pancreas: Unremarkable. Spleen: Unremarkable. Adrenals/Urinary Tract: Negative adrenals. Right hydroureteronephrosis, perinephric edema and delayed renal excretion from a 5 mm stone at the UVJ. There is excretion on the left, limiting detection of intrarenal calculi. No left hydronephrosis. Unremarkable bladder. Stomach/Bowel:  No obstruction. No appendicitis. Vascular/Lymphatic: No acute vascular abnormality. No mass or adenopathy. Reproductive:4 cm right ovarian cyst with some thin enhancing septations superiorly. Other: No ascites or pneumoperitoneum. Musculoskeletal: No acute abnormalities. IMPRESSION: 1. High-grade ureteral obstruction on the right from a 5 mm UVJ stone. 2. 4 cm right ovarian cyst with apparent enhancing septations. Strongly recommend pelvic ultrasound follow-up. 3. No appendicitis. Electronically Signed   By: Monte Fantasia M.D.   On: 11/11/2019 05:17    Procedures Procedures (including critical care time)  Medications Ordered in ED Medications  oxyCODONE-acetaminophen (PERCOCET/ROXICET) 5-325 MG per tablet 1 tablet (1 tablet Oral Given 11/10/19 2113)  sodium chloride flush (NS) 0.9 % injection 3 mL (3 mLs Intravenous Given 11/11/19 0342)  morphine 4 MG/ML injection 4 mg (4 mg Intravenous Given 11/11/19 0340)  ondansetron (ZOFRAN) injection 4 mg (4 mg Intravenous Given 11/11/19 0337)  sodium chloride 0.9 % bolus 1,000 mL (0 mLs Intravenous Stopped 11/11/19  0604)  iohexol (OMNIPAQUE) 300 MG/ML solution 125 mL (125 mLs Intravenous Contrast Given 11/11/19 0408)  ketorolac (TORADOL) 30 MG/ML injection 30 mg (30 mg Intravenous Given 11/11/19 0604)  HYDROmorphone (DILAUDID) injection 0.5 mg (0.5 mg Intravenous Given 11/11/19 0604)    ED Course  I have reviewed the triage vital signs and the nursing notes.  Pertinent labs & imaging results that were available during my care of the patient were reviewed by me and considered in my medical decision making (see chart for details).    MDM Rules/Calculators/A&P  44 year old female presenting to the ED with right-sided abdominal pain for the past few days. She has had associated nausea and vomiting and developed diarrhea over the past 24 hours. She is afebrile and nontoxic but does appear uncomfortable and nauseated on exam. She does have tenderness of her right side as depicted. Labs grossly reassuring, does have mild leukocytosis. We will plan for CT scan for further evaluation.  CT does confirm right sided ureteral stone, 4 mm with some obstructive changes. Also has incidental finding of right-sided ovarian cyst. I feel that her pain is likely due to her stone, however is recommended to have follow-up pelvis US which has been ordered.  She has had some return of pain so ordered additional toradol and dilaudid for pain control.  No further emesis here.  6:46 AM Pelvic US pending.  If no complicating features involving cyst, feel she will be stable for discharge home to follow-up with GYN.  Will also need FU with urology regarding kidney stone.  Discharge paperwork and prescriptions have been prepared for patient as along as pain is controlled and tolerating PO.  Final Clinical Impression(s) / ED Diagnoses Final diagnoses:  Ovarian cyst  Ureteral stone  Non-intractable vomiting with nausea, unspecified vomiting type    Rx / DC Orders ED Discharge Orders    None       Larene Pickett, PA-C 11/11/19  [id number]    Orpah Greek, MD 11/11/19 0700

## 2019-11-11 NOTE — H&P (View-Only) (Signed)
GYNECOLOGIC ONCOLOGY NEW PATIENT CONSULTATION   Patient Name: [name]  Patient Age: 44 y.o. Date of Service: 5/18 Referring Provider: Waldon Merl, PA-C [id number] [place],  [place] [address]   Primary Care Provider: Waldon Merl, PA-C Consulting Provider: Eugene Garnet, MD   Assessment/Plan:  Premenopausal patient with a complex adnexal mass.  Discussed ultrasound and CT findings during her recent emergency department visit.  I suspect that the patient's pain was related to her kidney stone and not the incidentally found ovarian mass.  Given some complex findings on ultrasound, we discussed treatment options including surveillance with serial imaging versus surgical excision.  After discussing the risks and benefits, the patient wishes to proceed with surgical excision.  We reviewed her normal CEA and CA-125 although discussed the limitations of CA-125 as this is not a specific or sensitive marker.  CA-125 can be elevated in noncancerous settings and can be normal in 30-50% of early stage ovarian cancer.  I reviewed the plan for removal of the enlarged ovary with intraoperative frozen section.  If no malignancy encountered, then I recommend unilateral oophorectomy and bilateral salpingectomy.  If ovarian cancer identified, then we discussed staging to include possible contralateral oophorectomy, total hysterectomy, lymph node biopsy, omentectomy, and any other indicated procedures.  We discussed plan for a robotic unilateral oophorectomy, bilateral salpingectomy,  possible staging to include contralateral oophorectomy, total hysterectomy, lymph node dissection, omentectomy, and possible laparotomy. The risks of surgery were discussed in detail and she understands these to include infection; wound separation; hernia; vaginal cuff separation, injury to adjacent organs such as bowel, bladder, blood vessels, ureters and nerves; bleeding which may require blood  transfusion; anesthesia risk; thromboembolic events; possible death; unforeseen complications; possible need for re-exploration; medical complications such as heart attack, stroke, pleural effusion and pneumonia; and, if full lymphadenectomy is performed the risk of lymphedema and lymphocyst. The patient will receive DVT and antibiotic prophylaxis as indicated. She voiced a clear understanding. She had the opportunity to ask questions. Perioperative instructions were reviewed with her.   A copy of this note was sent to the patient's referring provider.   45 minutes of total time was spent for this patient encounter, including preparation, face-to-face counseling with the patient and coordination of care, and documentation of the encounter.  Eugene Garnet, MD  Division of Gynecologic Oncology  Department of Obstetrics and Gynecology  [facility]  ___________________________________________  Chief Complaint: Chief Complaint  Patient presents with  . Cyst of ovary, unspecified laterality    New Patient    History of Present Illness:  [name] is a 44 y.o. y.o. female who is seen in consultation at the request of Noel Journey for an evaluation of an adnexal mass.  The patient presented to the ED on 5/14 with right-sided abdominal pain for several days. She also endorsed N/V. CT revealed right ureteral stone as well as complex 4cm right adnexal mass, confirmed on pelvic ultrasound.  She is currently taking Percocet for pain related to her kidney stone as well as Flomax.  She has not yet made a follow-up with urology.  She denies any nausea or emesis since she was seen in the emergency department and given antiemetics.  She endorses a good appetite.  She endorses normal bowel function.  She denies any pelvic cramping or pain, early satiety, or abdominal bloating.  PAST MEDICAL HISTORY:  Past Medical History:  Diagnosis Date  . Acid  indigestion    "  sometimes"  . Anxiety   . Depression   . Gallstones   . Glaucoma   . Headache    "sometimes"  . Kidney stones   . Obesity (BMI 30-39.9)   . Pelvic mass      PAST SURGICAL HISTORY:  Past Surgical History:  Procedure Laterality Date  . CESAREAN SECTION    . CHOLECYSTECTOMY N/A 11/15/2015   Procedure: LAPAROSCOPIC CHOLECYSTECTOMY WITH INTRAOPERATIVE CHOLANGIOGRAM;  Surgeon: Gaynelle Adu, MD;  Location: [facility] OR;  Service: General;  Laterality: N/A;  . dental implant     March 2019  . FOOT SURGERY     Bilateral  . lasic    . TOOTH EXTRACTION    . WISDOM TOOTH EXTRACTION      OB/GYN HISTORY:  OB History  Gravida Para Term Preterm AB Living  2 2          SAB TAB Ectopic Multiple Live Births               # Outcome Date GA Lbr Len/2nd Weight Sex Delivery Anes PTL Lv  2 Para           1 Para             No LMP recorded.  Age at menarche: 16  Age at menopause: n/a Hx of HRT: no Hx of STDs: denies Last pap: 2019, negative History of abnormal pap smears: yes, denies any biopsies or cervical procedures  SCREENING STUDIES:  Last mammogram: 2018  Last colonoscopy: n/a Last bone mineral density: n/a  MEDICATIONS: Outpatient Encounter Medications as of 11/15/2019  Medication Sig  . cetirizine (ZYRTEC) 10 MG tablet Take 10 mg by mouth daily.  Marland Kitchen Desvenlafaxine ER (PRISTIQ) 50 MG TB24 Take 1 tablet (50 mg total) by mouth daily.  . hyoscyamine (LEVSIN) 0.125 MG tablet Take 1 tablet (0.125 mg total) by mouth every 4 (four) hours as needed. (Patient taking differently: Take 0.125 mg by mouth every 4 (four) hours as needed for bladder spasms or cramping. )  . latanoprost (XALATAN) 0.005 % ophthalmic solution Place 1 drop into both eyes at bedtime.  . ondansetron (ZOFRAN ODT) 4 MG disintegrating tablet Take 1 tablet (4 mg total) by mouth every 8 (eight) hours as needed for nausea.  . ondansetron (ZOFRAN) 4 MG tablet Take 1 tablet (4 mg total) by mouth every 8 (eight)  hours as needed for nausea or vomiting.  Marland Kitchen oxyCODONE-acetaminophen (PERCOCET) 5-325 MG tablet Take 1 tablet by mouth every 4 (four) hours as needed.  . tamsulosin (FLOMAX) 0.4 MG CAPS capsule Take 1 capsule (0.4 mg total) by mouth daily after supper.  . Desvenlafaxine Succinate ER 25 MG TB24 Take 25 mg by mouth daily. (Patient not taking: Reported on 11/11/2019)  . L-Methylfolate 15 MG TABS Take 1 tablet (15 mg total) by mouth daily. (Patient not taking: Reported on 11/11/2019)   No facility-administered encounter medications on file as of 11/15/2019.    ALLERGIES:  No Known Allergies   FAMILY HISTORY:  Family History  Problem Relation Age of Onset  . Healthy Mother        Living  . Healthy Father        Living  . Breast cancer Maternal Grandmother   . Stroke Paternal Grandmother   . Diabetes Maternal Uncle   . Kidney disease Maternal Uncle   . Hyperlipidemia Paternal Uncle   . Hypertension Paternal Uncle   . Healthy Brother   . Healthy Sister   . Healthy Daughter  x2  . Ovarian cancer Neg Hx   . Uterine cancer Neg Hx   . Colon cancer Neg Hx      SOCIAL HISTORY:    Social Connections:   . Frequency of Communication with Friends and Family:   . Frequency of Social Gatherings with Friends and Family:   . Attends Religious Services:   . Active Member of Clubs or Organizations:   . Attends Banker Meetings:   Marland Kitchen Marital Status:     REVIEW OF SYSTEMS:  Pertinent positives as per HPI Denies appetite changes, fevers, chills, fatigue, unexplained weight changes. Denies hearing loss, neck lumps or masses, mouth sores, ringing in ears or voice changes. Denies cough or wheezing.  Denies shortness of breath. Denies chest pain or palpitations. Denies leg swelling. Denies abdominal distention, pain, blood in stools, constipation, diarrhea, or early satiety. Denies pain with intercourse, dysuria, frequency, hematuria or incontinence. Denies hot flashes, pelvic  pain, vaginal bleeding or vaginal discharge.   Denies joint pain, back pain or muscle pain/cramps. Denies itching, rash, or wounds. Denies dizziness, headaches, numbness or seizures. Denies swollen lymph nodes or glands, denies easy bruising or bleeding. Denies anxiety, depression, confusion, or decreased concentration.  Physical Exam:  Vital Signs for this encounter:  Blood pressure (!) 115/91, pulse (!) 102, temperature 98.3 F (36.8 C), temperature source Temporal, resp. rate 16, height 5\' 7"  (1.702 m), weight 242 lb (109.8 kg), SpO2 98 %. Body mass index is 37.9 kg/m. General: Alert, oriented, no acute distress.  HEENT: Normocephalic, atraumatic. Sclera anicteric.  Chest: Clear to auscultation bilaterally. No wheezes, rhonchi, or rales. Cardiovascular: Regular rate and rhythm, no murmurs, rubs, or gallops.  Abdomen: Obese. Normoactive bowel sounds. Soft, nondistended, nontender to palpation. No masses or hepatosplenomegaly appreciated. No palpable fluid wave.  Well-healed Pfannenstiel incision as well as laparoscopic incisions. Extremities: Grossly normal range of motion. Warm, well perfused. No edema bilaterally.  Skin: No rashes or lesions.  Lymphatics: No cervical, supraclavicular, or inguinal adenopathy.  GU:  Normal external female genitalia. No lesions. No discharge or bleeding.             Bladder/urethra:  No lesions or masses, well supported bladder             Vagina: Well rugated vaginal mucosa.             Cervix: Normal appearing, no lesions.  Posterior facing.             Uterus:  8-10cm, mobile, no parametrial involvement or nodularity.             Adnexa: Fullness appreciated in the cul-de-sac, size of mass itself difficult to appreciate.  No nodularity.  LABORATORY AND RADIOLOGIC DATA:  Outside medical records were reviewed to synthesize the above history, along with the history and physical obtained during the visit.   Lab Results  Component Value Date   WBC  13.3 (H) 11/10/2019   HGB 13.7 11/10/2019   HCT 42.7 11/10/2019   PLT 359 11/10/2019   GLUCOSE 114 (H) 11/10/2019   CHOL 176 07/30/2017   TRIG 72 07/30/2017   HDL 42 07/30/2017   LDLCALC 118 07/30/2017   ALT 29 11/10/2019   AST 81 (H) 11/10/2019   NA 140 11/10/2019   K 3.5 11/10/2019   CL 106 11/10/2019   CREATININE 1.04 (H) 11/10/2019   BUN 11 11/10/2019   CO2 20 (L) 11/10/2019   TSH 0.70 07/30/2017   HGBA1C 5.1 07/30/2017   CEA:  1.5 CA-125: 16  CT A/P on 5/14: IMPRESSION: 1. High-grade ureteral obstruction on the right from a 5 mm UVJ stone. 2. 4 cm right ovarian cyst with apparent enhancing septations. Strongly recommend pelvic ultrasound follow-up. 3. No appendicitis.  Pelvic ultrasound on 5/14: IMPRESSION: 1. 4.1 cm complex cystic lesion in the right ovary concerning for ovarian neoplasm. Gynecological oncology consultation is recommended. 2. Nonvisualization of the left ovary. 3. 5 mm right distal ureteral calculus.

## 2019-11-11 NOTE — Discharge Instructions (Addendum)
Take the prescribed medication as directed. Follow-up with urology-- can call for appt. I would follow-up the GYN regarding the cyst.  If you do not have one you may follow-up with the women's clinic. Return to the ED for new or worsening symptoms.

## 2019-11-11 NOTE — ED Provider Notes (Signed)
  Physical Exam  BP (!) 146/87   Pulse 81   Temp 98.1 F (36.7 C)   Resp 18   Ht 5\' 7"  (1.702 m)   Wt 108.9 kg   LMP 09/19/2019 (Exact Date)   SpO2 99%   BMI 37.59 kg/m   Physical Exam  ED Course/Procedures     Procedures  MDM  Patient care assumed from [place]. PA at shift change, please see her note for a full HPI. Briefly, patient with right sided abdominal pain, worsening, does have a 4 mm with obstructive changes. Incidental finding reveal complicated cyst therefore Pelvic [place] obtained. Plan is for follow up on [place] and pending dispo. Prior provider has written medication for symptomatic treatment of right sided uretal stone.   7:19 AM Pelvic US showed:  1. 4.1 cm complex cystic lesion in the right ovary concerning for  ovarian neoplasm. Gynecological oncology consultation is  recommended.  2. Nonvisualization of the left ovary.  3. 5 mm right distal ureteral calculus.     Consultation for Gyn Onc has been placed for further recommendations.  8:33 AM spoke to [facility] oncology nursing staff, unfortunately physicians in the OR.  They have been aware of patient, granted her an appointment for next Tuesday.  We will be adding CEA, CA-125 markers prior to patient discharge.  8:39 AM discussed results of ultrasound imaging with patient, she is aware an appointment has been made for her for next Tuesday in order to obtain follow-up.  She is agreeable of plan, will be adding blood along with discharging patient.  She is in no distress, pain has resolved.  Does have prescription for pain medication written by prior provider for pain control at home.  Patient is stable for discharge.   Portions of this note were generated with Thursday. Dictation errors may occur despite best attempts at proofreading.     Scientist, clinical (histocompatibility and immunogenetics), PA-C 11/11/19 11/13/19    0938, MD 11/12/19 [phone]

## 2019-11-11 NOTE — Progress Notes (Signed)
GYNECOLOGIC ONCOLOGY NEW PATIENT CONSULTATION   Patient Name: [name]  Patient Age: 44 y.o. Date of Service: 5/18 Referring Provider: Martin, William C, PA-C [address] [place],  [place] [address]   Primary Care Provider: Martin, William C, PA-C Consulting Provider: Lanell Carpenter, MD   Assessment/Plan:  Premenopausal patient with a complex adnexal mass.  Discussed ultrasound and CT findings during her recent emergency department visit.  I suspect that the patient's pain was related to her kidney stone and not the incidentally found ovarian mass.  Given some complex findings on ultrasound, we discussed treatment options including surveillance with serial imaging versus surgical excision.  After discussing the risks and benefits, the patient wishes to proceed with surgical excision.  We reviewed her normal CEA and CA-125 although discussed the limitations of CA-125 as this is not a specific or sensitive marker.  CA-125 can be elevated in noncancerous settings and can be normal in 30-50% of early stage ovarian cancer.  I reviewed the plan for removal of the enlarged ovary with intraoperative frozen section.  If no malignancy encountered, then I recommend unilateral oophorectomy and bilateral salpingectomy.  If ovarian cancer identified, then we discussed staging to include possible contralateral oophorectomy, total hysterectomy, lymph node biopsy, omentectomy, and any other indicated procedures.  We discussed plan for a robotic unilateral oophorectomy, bilateral salpingectomy,  possible staging to include contralateral oophorectomy, total hysterectomy, lymph node dissection, omentectomy, and possible laparotomy. The risks of surgery were discussed in detail and she understands these to include infection; wound separation; hernia; vaginal cuff separation, injury to adjacent organs such as bowel, bladder, blood vessels, ureters and nerves; bleeding which may require blood  transfusion; anesthesia risk; thromboembolic events; possible death; unforeseen complications; possible need for re-exploration; medical complications such as heart attack, stroke, pleural effusion and pneumonia; and, if full lymphadenectomy is performed the risk of lymphedema and lymphocyst. The patient will receive DVT and antibiotic prophylaxis as indicated. She voiced a clear understanding. She had the opportunity to ask questions. Perioperative instructions were reviewed with her.   A copy of this note was sent to the patient's referring provider.   46 minutes of total time was spent for this patient encounter, including preparation, face-to-face counseling with the patient and coordination of care, and documentation of the encounter.  Kassius Battiste, MD  Division of Gynecologic Oncology  Department of Obstetrics and Gynecology  [facility]  ___________________________________________  Chief Complaint: Chief Complaint  Patient presents with  . Cyst of ovary, unspecified laterality    New Patient    History of Present Illness:  [name] is a 44 y.o. y.o. female who is seen in consultation at the request of Martin, William C, PA-C for an evaluation of an adnexal mass.  The patient presented to the ED on 5/14 with right-sided abdominal pain for several days. She also endorsed N/V. CT revealed right ureteral stone as well as complex 4cm right adnexal mass, confirmed on pelvic ultrasound.  She is currently taking Percocet for pain related to her kidney stone as well as Flomax.  She has not yet made a follow-up with urology.  She denies any nausea or emesis since she was seen in the emergency department and given antiemetics.  She endorses a good appetite.  She endorses normal bowel function.  She denies any pelvic cramping or pain, early satiety, or abdominal bloating.  PAST MEDICAL HISTORY:  Past Medical History:  Diagnosis Date  . Acid  indigestion    "  sometimes"  . Anxiety   . Depression   . Gallstones   . Glaucoma   . Headache    "sometimes"  . Kidney stones   . Obesity (BMI 30-39.9)   . Pelvic mass      PAST SURGICAL HISTORY:  Past Surgical History:  Procedure Laterality Date  . CESAREAN SECTION    . CHOLECYSTECTOMY N/A 11/15/2015   Procedure: LAPAROSCOPIC CHOLECYSTECTOMY WITH INTRAOPERATIVE CHOLANGIOGRAM;  Surgeon: Gaynelle Adu, MD;  Location: [facility] OR;  Service: General;  Laterality: N/A;  . dental implant     March 2019  . FOOT SURGERY     Bilateral  . lasic    . TOOTH EXTRACTION    . WISDOM TOOTH EXTRACTION      OB/GYN HISTORY:  OB History  Gravida Para Term Preterm AB Living  2 2          SAB TAB Ectopic Multiple Live Births               # Outcome Date GA Lbr Len/2nd Weight Sex Delivery Anes PTL Lv  2 Para           1 Para             No LMP recorded.  Age at menarche: 16  Age at menopause: n/a Hx of HRT: no Hx of STDs: denies Last pap: 2019, negative History of abnormal pap smears: yes, denies any biopsies or cervical procedures  SCREENING STUDIES:  Last mammogram: 2018  Last colonoscopy: n/a Last bone mineral density: n/a  MEDICATIONS: Outpatient Encounter Medications as of 11/15/2019  Medication Sig  . cetirizine (ZYRTEC) 10 MG tablet Take 10 mg by mouth daily.  Marland Kitchen Desvenlafaxine ER (PRISTIQ) 50 MG TB24 Take 1 tablet (50 mg total) by mouth daily.  . hyoscyamine (LEVSIN) 0.125 MG tablet Take 1 tablet (0.125 mg total) by mouth every 4 (four) hours as needed. (Patient taking differently: Take 0.125 mg by mouth every 4 (four) hours as needed for bladder spasms or cramping. )  . latanoprost (XALATAN) 0.005 % ophthalmic solution Place 1 drop into both eyes at bedtime.  . ondansetron (ZOFRAN ODT) 4 MG disintegrating tablet Take 1 tablet (4 mg total) by mouth every 8 (eight) hours as needed for nausea.  . ondansetron (ZOFRAN) 4 MG tablet Take 1 tablet (4 mg total) by mouth every 8 (eight)  hours as needed for nausea or vomiting.  Marland Kitchen oxyCODONE-acetaminophen (PERCOCET) 5-325 MG tablet Take 1 tablet by mouth every 4 (four) hours as needed.  . tamsulosin (FLOMAX) 0.4 MG CAPS capsule Take 1 capsule (0.4 mg total) by mouth daily after supper.  . Desvenlafaxine Succinate ER 25 MG TB24 Take 25 mg by mouth daily. (Patient not taking: Reported on 11/11/2019)  . L-Methylfolate 15 MG TABS Take 1 tablet (15 mg total) by mouth daily. (Patient not taking: Reported on 11/11/2019)   No facility-administered encounter medications on file as of 11/15/2019.    ALLERGIES:  No Known Allergies   FAMILY HISTORY:  Family History  Problem Relation Age of Onset  . Healthy Mother        Living  . Healthy Father        Living  . Breast cancer Maternal Grandmother   . Stroke Paternal Grandmother   . Diabetes Maternal Uncle   . Kidney disease Maternal Uncle   . Hyperlipidemia Paternal Uncle   . Hypertension Paternal Uncle   . Healthy Brother   . Healthy Sister   . Healthy Daughter  x2  . Ovarian cancer Neg Hx   . Uterine cancer Neg Hx   . Colon cancer Neg Hx      SOCIAL HISTORY:    Social Connections:   . Frequency of Communication with Friends and Family:   . Frequency of Social Gatherings with Friends and Family:   . Attends Religious Services:   . Active Member of Clubs or Organizations:   . Attends Banker Meetings:   Marland Kitchen Marital Status:     REVIEW OF SYSTEMS:  Pertinent positives as per HPI Denies appetite changes, fevers, chills, fatigue, unexplained weight changes. Denies hearing loss, neck lumps or masses, mouth sores, ringing in ears or voice changes. Denies cough or wheezing.  Denies shortness of breath. Denies chest pain or palpitations. Denies leg swelling. Denies abdominal distention, pain, blood in stools, constipation, diarrhea, or early satiety. Denies pain with intercourse, dysuria, frequency, hematuria or incontinence. Denies hot flashes, pelvic  pain, vaginal bleeding or vaginal discharge.   Denies joint pain, back pain or muscle pain/cramps. Denies itching, rash, or wounds. Denies dizziness, headaches, numbness or seizures. Denies swollen lymph nodes or glands, denies easy bruising or bleeding. Denies anxiety, depression, confusion, or decreased concentration.  Physical Exam:  Vital Signs for this encounter:  Blood pressure (!) 115/91, pulse (!) 102, temperature 98.3 F (36.8 C), temperature source Temporal, resp. rate 16, height 5\' 7"  (1.702 m), weight 242 lb (109.8 kg), SpO2 98 %. Body mass index is 37.9 kg/m. General: Alert, oriented, no acute distress.  HEENT: Normocephalic, atraumatic. Sclera anicteric.  Chest: Clear to auscultation bilaterally. No wheezes, rhonchi, or rales. Cardiovascular: Regular rate and rhythm, no murmurs, rubs, or gallops.  Abdomen: Obese. Normoactive bowel sounds. Soft, nondistended, nontender to palpation. No masses or hepatosplenomegaly appreciated. No palpable fluid wave.  Well-healed Pfannenstiel incision as well as laparoscopic incisions. Extremities: Grossly normal range of motion. Warm, well perfused. No edema bilaterally.  Skin: No rashes or lesions.  Lymphatics: No cervical, supraclavicular, or inguinal adenopathy.  GU:  Normal external female genitalia. No lesions. No discharge or bleeding.             Bladder/urethra:  No lesions or masses, well supported bladder             Vagina: Well rugated vaginal mucosa.             Cervix: Normal appearing, no lesions.  Posterior facing.             Uterus:  8-10cm, mobile, no parametrial involvement or nodularity.             Adnexa: Fullness appreciated in the cul-de-sac, size of mass itself difficult to appreciate.  No nodularity.  LABORATORY AND RADIOLOGIC DATA:  Outside medical records were reviewed to synthesize the above history, along with the history and physical obtained during the visit.   Lab Results  Component Value Date   WBC  13.3 (H) 11/10/2019   HGB 13.7 11/10/2019   HCT 42.7 11/10/2019   PLT 359 11/10/2019   GLUCOSE 114 (H) 11/10/2019   CHOL 176 07/30/2017   TRIG 72 07/30/2017   HDL 42 07/30/2017   LDLCALC 118 07/30/2017   ALT 29 11/10/2019   AST 81 (H) 11/10/2019   NA 140 11/10/2019   K 3.5 11/10/2019   CL 106 11/10/2019   CREATININE 1.04 (H) 11/10/2019   BUN 11 11/10/2019   CO2 20 (L) 11/10/2019   TSH 0.70 07/30/2017   HGBA1C 5.1 07/30/2017   CEA:  1.5 CA-125: 16  CT A/P on 5/14: IMPRESSION: 1. High-grade ureteral obstruction on the right from a 5 mm UVJ stone. 2. 4 cm right ovarian cyst with apparent enhancing septations. Strongly recommend pelvic ultrasound follow-up. 3. No appendicitis.  Pelvic ultrasound on 5/14: IMPRESSION: 1. 4.1 cm complex cystic lesion in the right ovary concerning for ovarian neoplasm. Gynecological oncology consultation is recommended. 2. Nonvisualization of the left ovary. 3. 5 mm right distal ureteral calculus.

## 2019-11-12 LAB — CA 125: Cancer Antigen (CA) 125: 16 U/mL (ref 0.0–38.1)

## 2019-11-12 LAB — CEA: CEA: 1.5 ng/mL (ref 0.0–4.7)

## 2019-11-15 ENCOUNTER — Inpatient Hospital Stay: Payer: BC Managed Care – PPO | Attending: Gynecologic Oncology | Admitting: Gynecologic Oncology

## 2019-11-15 ENCOUNTER — Other Ambulatory Visit: Payer: Self-pay | Admitting: Gynecologic Oncology

## 2019-11-15 ENCOUNTER — Other Ambulatory Visit: Payer: Self-pay

## 2019-11-15 ENCOUNTER — Encounter: Payer: Self-pay | Admitting: Gynecologic Oncology

## 2019-11-15 VITALS — BP 115/91 | HR 102 | Temp 98.3°F | Resp 16 | Ht 67.0 in | Wt 242.0 lb

## 2019-11-15 DIAGNOSIS — N83201 Unspecified ovarian cyst, right side: Secondary | ICD-10-CM | POA: Diagnosis not present

## 2019-11-15 DIAGNOSIS — N83209 Unspecified ovarian cyst, unspecified side: Secondary | ICD-10-CM

## 2019-11-15 NOTE — Patient Instructions (Signed)
Preparing for your Surgery  Plan for surgery on December 06, 2019 with Dr. Eugene Garnet at [facility]. You will be scheduled for a robotic assisted laparoscopic unilateral oophorectomy, bilateral salpingectomy (removal of both fallopian tubes), possible staging if cancer identified including total laparoscopic hysterectomy, removal of other ovary, omentectomy, possible laparotomy.   Pre-operative Testing -You will receive a phone call from presurgical testing at [facility] to arrange for a pre-operative appointment over the phone, lab appointment, and COVID test. The COVID test normally happens 3 days prior to the surgery and they ask that you self quarantine after the test up until surgery to decrease chance of exposure.  -Bring your insurance card, copy of an advanced directive if applicable, medication list  -At that visit, you will be asked to sign a consent for a possible blood transfusion in case a transfusion becomes necessary during surgery.  The need for a blood transfusion is rare but having consent is a necessary part of your care.     -You should not be taking blood thinners or aspirin at least ten days prior to surgery unless instructed by your surgeon.  -Do not take supplements such as fish oil (omega 3), red yeast rice, turmeric before your surgery.   Day Before Surgery at Home -You will be asked to take in a light diet the day before surgery.  Avoid carbonated beverages.  You will be advised you can have clear liquids after midnight and up until 3 hours before your surgery.    Eat a light diet the day before surgery.  Examples including soups, broths, toast, yogurt, mashed potatoes.  AVOID ITEMS THAT CAUSE GAS. Things to avoid include carbonated beverages (fizzy beverages), raw fruits and raw vegetables, or beans.   If your bowels are filled with gas, your surgeon will have difficulty visualizing your pelvic organs which increases your surgical risks.  Your  role in recovery Your role is to become active as soon as directed by your doctor, while still giving yourself time to heal.  Rest when you feel tired. You will be asked to do the following in order to speed your recovery:  - Cough and breathe deeply. This helps to clear and expand your lungs and can prevent pneumonia after surgery.  - STAY ACTIVE WHEN YOU GET HOME. Do mild physical activity. Walking or moving your legs help your circulation and body functions return to normal. Do not try to get up or walk alone the first time after surgery.   -If you develop swelling on one leg or the other, pain in the back of your leg, redness/warmth in one of your legs, please call the office or go to the Emergency Room to have a doppler to rule out a blood clot. For shortness of breath, chest pain-seek care in the Emergency Room as soon as possible. - Actively manage your pain. Managing your pain lets you move in comfort. We will ask you to rate your pain on a scale of zero to 10. It is your responsibility to tell your doctor or nurse where and how much you hurt so your pain can be treated.  Special Considerations -If you are diabetic, you may be placed on insulin after surgery to have closer control over your blood sugars to promote healing and recovery.  This does not mean that you will be discharged on insulin.  If applicable, your oral antidiabetics will be resumed when you are tolerating a solid diet.  -Your final pathology  results from surgery should be available around one week after surgery and the results will be relayed to you when available.  -Dr. Antionette Char is the surgeon that assists your GYN Oncologist with surgery.  If you end up staying the night, the next day after your surgery you will either see Dr. Andrey Farmer, Dr. Pricilla Holm, or Dr. Antionette Char.  -FMLA forms can be faxed to [phone] and please allow 5-7 business days for completion.  Pain Management After Surgery -Make sure that  you have Tylenol and Ibuprofen at home to use on a regular basis after surgery for pain control. We recommend alternating the medications every hour to six hours since they work differently and are processed in the body differently for pain relief.  Bowel Regimen -You will be prescribed Sennakot-S to take nightly to prevent constipation especially if you are taking the narcotic pain medication intermittently.  It is important to prevent constipation and drink adequate amounts of liquids. You can stop taking this medication when you are not taking pain medication and you are back on your normal bowel routine.  Risks of Surgery Risks of surgery are low but include bleeding, infection, damage to surrounding structures, re-operation, blood clots, and very rarely death.   Blood Transfusion Information (For the consent to be signed before surgery)  We will be checking your blood type before surgery so in case of emergencies, we will know what type of blood you would need.                                            WHAT IS A BLOOD TRANSFUSION?  A transfusion is the replacement of blood or some of its parts. Blood is made up of multiple cells which provide different functions.  Red blood cells carry oxygen and are used for blood loss replacement.  White blood cells fight against infection.  Platelets control bleeding.  Plasma helps clot blood.  Other blood products are available for specialized needs, such as hemophilia or other clotting disorders. BEFORE THE TRANSFUSION  Who gives blood for transfusions?   You may be able to donate blood to be used at a later date on yourself (autologous donation).  Relatives can be asked to donate blood. This is generally not any safer than if you have received blood from a stranger. The same precautions are taken to ensure safety when a relative's blood is donated.  Healthy volunteers who are fully evaluated to make sure their blood is safe. This is blood  bank blood. Transfusion therapy is the safest it has ever been in the practice of medicine. Before blood is taken from a donor, a complete history is taken to make sure that person has no history of diseases nor engages in risky social behavior (examples are intravenous drug use or sexual activity with multiple partners). The donor's travel history is screened to minimize risk of transmitting infections, such as malaria. The donated blood is tested for signs of infectious diseases, such as HIV and hepatitis. The blood is then tested to be sure it is compatible with you in order to minimize the chance of a transfusion reaction. If you or a relative donates blood, this is often done in anticipation of surgery and is not appropriate for emergency situations. It takes many days to process the donated blood. RISKS AND COMPLICATIONS Although transfusion therapy is very safe and saves  many lives, the main dangers of transfusion include:   Getting an infectious disease.  Developing a transfusion reaction. This is an allergic reaction to something in the blood you were given. Every precaution is taken to prevent this. The decision to have a blood transfusion has been considered carefully by your caregiver before blood is given. Blood is not given unless the benefits outweigh the risks.  AFTER SURGERY INSTRUCTIONS  Return to work: 4-6 weeks if applicable  Activity: 1. Be up and out of the bed during the day.  Take a nap if needed.  You may walk up steps but be careful and use the hand rail.  Stair climbing will tire you more than you think, you may need to stop part way and rest.   2. No lifting or straining for 6 weeks over 10 pounds. No pushing, pulling, straining for 6 weeks.  3. No driving for around 1 week(s).  Do not drive if you are taking narcotic pain medicine and make sure that your reaction time has returned.   4. You can shower as soon as the next day after surgery. Shower daily.  Use soap and  water on your incision and pat dry; don't rub.  No tub baths or submerging your body in water until cleared by your surgeon. If you have the soap that was given to you by pre-surgical testing that was used before surgery, you do not need to use it afterwards because this can irritate your incisions.   5. No sexual activity and nothing in the vagina for 2 weeks IF NO HYSTERECTOMY.  IF YOU HAVE A HYSTERECTOMY, NOTHING IN VAGINA FOR 8 WEEKS.  6. You may experience a small amount of clear drainage from your incisions, which is normal.  If the drainage persists, increases, or changes color please call the office.  7. Do not use creams, lotions, or ointments such as neosporin on your incisions after surgery until advised by your surgeon because they can cause removal of the dermabond glue on your incisions.    8. You may experience vaginal spotting after surgery or around the 6-8 week mark from surgery when the stitches at the top of the vagina begin to dissolve IF YOU HAVE A HYSTERECTOMY.  The spotting is normal but if you experience heavy bleeding, call our office.  9. Take Tylenol or ibuprofen first for pain and only use narcotic pain medication for severe pain not relieved by the Tylenol or Ibuprofen.  Monitor your Tylenol intake to a max of 4,000 mg.  Diet: 1. Low sodium Heart Healthy Diet is recommended.  2. It is safe to use a laxative, such as Miralax or Colace, if you have difficulty moving your bowels. You can take Sennakot at bedtime every evening to keep bowel movements regular and to prevent constipation.    Wound Care: 1. Keep clean and dry.  Shower daily.  Reasons to call the Doctor:  Fever - Oral temperature greater than 100.4 degrees Fahrenheit  Foul-smelling vaginal discharge  Difficulty urinating  Nausea and vomiting  Increased pain at the site of the incision that is unrelieved with pain medicine.  Difficulty breathing with or without chest pain  New calf pain  especially if only on one side  Sudden, continuing increased vaginal bleeding with or without clots.   Contacts: For questions or concerns you should contact:  Dr. Jeral Pinch at [phone]  Joylene John, NP at [phone]  After Hours: call [phone] and have the GYN Oncologist paged/contacted

## 2019-11-17 ENCOUNTER — Telehealth: Payer: Self-pay | Admitting: *Deleted

## 2019-11-17 NOTE — Telephone Encounter (Signed)
Patient called and left a message if she can drop off FMLA paperwork instead of faxing. Called that patient back and explained to bring to the front desk and leave for [employer] in [place]

## 2019-11-21 NOTE — Patient Instructions (Addendum)
DUE TO COVID-19 ONLY ONE VISITOR IS ALLOWED TO COME WITH YOU AND STAY IN THE WAITING ROOM ONLY DURING PRE OP AND PROCEDURE DAY OF SURGERY. THE 1 VISITOR MAY VISIT WITH YOU AFTER SURGERY IN YOUR PRIVATE ROOM DURING VISITING HOURS ONLY!  YOU NEED TO HAVE A COVID 19 TEST ON: 12/02/19 @ 9:00 am , THIS TEST MUST BE DONE BEFORE SURGERY, COME  [address], [place] , [address].  [facility]) ONCE YOUR COVID TEST IS COMPLETED, PLEASE BEGIN THE QUARANTINE INSTRUCTIONS AS OUTLINED IN YOUR HANDOUT.                [name]   Your procedure is scheduled on: 12/06/19   Report to [facility] Main  Entrance   Report to admitting at: 9:00 AM     Call this number if you have problems the morning of surgery [phone]    Remember: Do not eat solid food :After Midnight. Clear liquid diet from midnight until 8:00 am    CLEAR LIQUID DIET   Foods Allowed                                                                     Foods Excluded  Coffee and tea, regular and decaf                             liquids that you cannot  Plain Jell-O any favor except red or purple                                           see through such as: Fruit ices (not with fruit pulp)                                     milk, soups, orange juice  Iced Popsicles                                    All solid food Carbonated beverages, regular and diet                                    Cranberry, grape and apple juices Sports drinks like Gatorade Lightly seasoned clear broth or consume(fat free) Sugar, honey syrup  Sample Menu Breakfast                                Lunch                                     Supper Cranberry juice                    Beef broth  Chicken broth Jell-O                                     Grape juice                           Apple juice Coffee or tea                        Jell-O                                      Popsicle                                                 Coffee or tea                        Coffee or tea  _____________________________________________________________________   BRUSH YOUR TEETH MORNING OF SURGERY AND RINSE YOUR MOUTH OUT, NO CHEWING GUM CANDY OR MINTS.     Take these medicines the morning of surgery with A SIP OF WATER: cetirizine,hyoscyamine,desvenlafaxine.                                 You may not have any metal on your body including hair pins and              piercings  Do not wear jewelry, make-up, lotions, powders or perfumes, deodorant             Do not wear nail polish on your fingernails.  Do not shave  48 hours prior to surgery.               Do not bring valuables to the hospital. [facility] IS NOT             RESPONSIBLE   FOR VALUABLES.  Contacts, dentures or bridgework may not be worn into surgery.  Leave suitcase in the car. After surgery it may be brought to your room.     Patients discharged the day of surgery will not be allowed to drive home. IF YOU ARE HAVING SURGERY AND GOING HOME THE SAME DAY, YOU MUST HAVE AN ADULT TO DRIVE YOU HOME AND BE WITH YOU FOR 24 HOURS. YOU MAY GO HOME BY TAXI OR UBER OR ORTHERWISE, BUT AN ADULT MUST ACCOMPANY YOU HOME AND STAY WITH YOU FOR 24 HOURS.  Name and phone number of your driver:  Special Instructions: N/A              Please read over the following fact sheets you were given: _____________________________________________________________________  [facility] - Preparing for Surgery Before surgery, you can play an important role.  Because skin is not sterile, your skin needs to be as free of germs as possible.  You can reduce the number of germs on your skin by washing with CHG (chlorahexidine gluconate) soap before surgery.  CHG is an antiseptic cleaner which kills germs and bonds with the skin to continue killing germs even after washing. Please DO NOT use if you  have an allergy to CHG or antibacterial soaps.  If your  skin becomes reddened/irritated stop using the CHG and inform your nurse when you arrive at Short Stay. Do not shave (including legs and underarms) for at least 48 hours prior to the first CHG shower.  You may shave your face/neck. Please follow these instructions carefully:  1.  Shower with CHG Soap the night before surgery and the  morning of Surgery.  2.  If you choose to wash your hair, wash your hair first as usual with your  normal  shampoo.  3.  After you shampoo, rinse your hair and body thoroughly to remove the  shampoo.                           4.  Use CHG as you would any other liquid soap.  You can apply chg directly  to the skin and wash                       Gently with a scrungie or clean washcloth.  5.  Apply the CHG Soap to your body ONLY FROM THE NECK DOWN.   Do not use on face/ open                           Wound or open sores. Avoid contact with eyes, ears mouth and genitals (private parts).                       Wash face,  Genitals (private parts) with your normal soap.             6.  Wash thoroughly, paying special attention to the area where your surgery  will be performed.  7.  Thoroughly rinse your body with warm water from the neck down.  8.  DO NOT shower/wash with your normal soap after using and rinsing off  the CHG Soap.                9.  Pat yourself dry with a clean towel.            10.  Wear clean pajamas.            11.  Place clean sheets on your bed the night of your first shower and do not  sleep with pets. Day of Surgery : Do not apply any lotions/deodorants the morning of surgery.  Please wear clean clothes to the hospital/surgery center.  FAILURE TO FOLLOW THESE INSTRUCTIONS MAY RESULT IN THE CANCELLATION OF YOUR SURGERY PATIENT SIGNATURE_________________________________  NURSE SIGNATURE__________________________________  ________________________________________________________________________

## 2019-11-23 ENCOUNTER — Encounter (HOSPITAL_COMMUNITY)
Admission: RE | Admit: 2019-11-23 | Discharge: 2019-11-23 | Disposition: A | Discharge status: Home / Self Care | Payer: BC Managed Care – PPO | Source: Ambulatory Visit | Attending: Gynecologic Oncology | Admitting: Gynecologic Oncology

## 2019-11-23 ENCOUNTER — Other Ambulatory Visit: Payer: Self-pay

## 2019-11-23 ENCOUNTER — Encounter (HOSPITAL_COMMUNITY): Payer: Self-pay

## 2019-11-23 DIAGNOSIS — Z01812 Encounter for preprocedural laboratory examination: Secondary | ICD-10-CM | POA: Diagnosis present

## 2019-11-23 HISTORY — DX: Personal history of urinary calculi: Z87.442

## 2019-11-23 LAB — PREGNANCY, URINE: Preg Test, Ur: NEGATIVE

## 2019-11-23 LAB — CBC
HCT: 42.2 % (ref 36.0–46.0)
Hemoglobin: 13.4 g/dL (ref 12.0–15.0)
MCH: 29.1 pg (ref 26.0–34.0)
MCHC: 31.8 g/dL (ref 30.0–36.0)
MCV: 91.5 fL (ref 80.0–100.0)
Platelets: 368 10*3/uL (ref 150–400)
RBC: 4.61 MIL/uL (ref 3.87–5.11)
RDW: 12.7 % (ref 11.5–15.5)
WBC: 7.8 10*3/uL (ref 4.0–10.5)
nRBC: 0 % (ref 0.0–0.2)

## 2019-11-23 LAB — BASIC METABOLIC PANEL
Anion gap: 9 (ref 5–15)
BUN: 10 mg/dL (ref 6–20)
CO2: 26 mmol/L (ref 22–32)
Calcium: 9.3 mg/dL (ref 8.9–10.3)
Chloride: 104 mmol/L (ref 98–111)
Creatinine, Ser: 0.72 mg/dL (ref 0.44–1.00)
GFR calc Af Amer: >60 mL/min (ref >60)
GFR calc non Af Amer: >60 mL/min (ref >60)
Glucose, Bld: 79 mg/dL (ref 70–99)
Potassium: 3.8 mmol/L (ref 3.5–5.1)
Sodium: 139 mmol/L (ref 135–145)

## 2019-11-23 LAB — URINALYSIS, ROUTINE W REFLEX MICROSCOPIC
Bilirubin Urine: NEGATIVE
Glucose, UA: NEGATIVE mg/dL
Hgb urine dipstick: NEGATIVE
Ketones, ur: NEGATIVE mg/dL
Leukocytes,Ua: NEGATIVE
Nitrite: NEGATIVE
Protein, ur: NEGATIVE mg/dL
Specific Gravity, Urine: 1.024 (ref 1.005–1.030)
pH: 5 (ref 5.0–8.0)

## 2019-11-23 LAB — ABO/RH: ABO/RH(D): O POS

## 2019-11-23 LAB — TYPE AND SCREEN
ABO/RH(D): O POS
Antibody Screen: NEGATIVE

## 2019-11-23 NOTE — Progress Notes (Signed)
PCP - Dr. Marcelline Mates Cardiologist -   Chest x-ray -  EKG -  Stress Test -  ECHO -  Cardiac Cath -   Sleep Study -  CPAP -   Fasting Blood Sugar -  Checks Blood Sugar _____ times a day  Blood Thinner Instructions: Aspirin Instructions: Last Dose:  Anesthesia review:   Patient denies shortness of breath, fever, cough and chest pain at PAT appointment   Patient verbalized understanding of instructions that were given to them at the PAT appointment. Patient was also instructed that they will need to review over the PAT instructions again at home before surgery.

## 2019-12-02 ENCOUNTER — Other Ambulatory Visit (HOSPITAL_COMMUNITY)
Admission: RE | Admit: 2019-12-02 | Discharge: 2019-12-02 | Disposition: A | Discharge status: Home / Self Care | Payer: BC Managed Care – PPO | Source: Ambulatory Visit | Attending: Gynecologic Oncology | Admitting: Gynecologic Oncology

## 2019-12-02 DIAGNOSIS — Z01812 Encounter for preprocedural laboratory examination: Secondary | ICD-10-CM | POA: Diagnosis not present

## 2019-12-02 DIAGNOSIS — Z20822 Contact with and (suspected) exposure to covid-19: Secondary | ICD-10-CM | POA: Diagnosis not present

## 2019-12-02 LAB — SARS CORONAVIRUS 2 (TAT 6-24 HRS): SARS Coronavirus 2: NEGATIVE

## 2019-12-05 ENCOUNTER — Telehealth: Payer: Self-pay

## 2019-12-05 NOTE — Telephone Encounter (Signed)
[name]  States that she understands her pre op instructions.  Told her that she does not need to drink the ensure drink provided in her pre op packet. Notified Dr. Pricilla Holm of the above.

## 2019-12-06 ENCOUNTER — Other Ambulatory Visit: Payer: Self-pay

## 2019-12-06 ENCOUNTER — Encounter (HOSPITAL_COMMUNITY): Payer: Self-pay | Admitting: Gynecologic Oncology

## 2019-12-06 ENCOUNTER — Ambulatory Visit (HOSPITAL_COMMUNITY): Payer: BC Managed Care – PPO | Admitting: Anesthesiology

## 2019-12-06 ENCOUNTER — Encounter (HOSPITAL_COMMUNITY)
Admission: RE | Disposition: A | Discharge status: Home / Self Care | Payer: Self-pay | Source: Ambulatory Visit | Attending: Gynecologic Oncology

## 2019-12-06 ENCOUNTER — Ambulatory Visit (HOSPITAL_COMMUNITY)
Admission: RE | Admit: 2019-12-06 | Discharge: 2019-12-06 | Disposition: A | Discharge status: Home / Self Care | Payer: BC Managed Care – PPO | Source: Ambulatory Visit | Attending: Gynecologic Oncology | Admitting: Gynecologic Oncology

## 2019-12-06 DIAGNOSIS — N201 Calculus of ureter: Secondary | ICD-10-CM | POA: Diagnosis not present

## 2019-12-06 DIAGNOSIS — Z6837 Body mass index (BMI) 37.0-37.9, adult: Secondary | ICD-10-CM | POA: Insufficient documentation

## 2019-12-06 DIAGNOSIS — Z79899 Other long term (current) drug therapy: Secondary | ICD-10-CM | POA: Diagnosis not present

## 2019-12-06 DIAGNOSIS — F419 Anxiety disorder, unspecified: Secondary | ICD-10-CM | POA: Diagnosis not present

## 2019-12-06 DIAGNOSIS — E669 Obesity, unspecified: Secondary | ICD-10-CM | POA: Diagnosis not present

## 2019-12-06 DIAGNOSIS — N83201 Unspecified ovarian cyst, right side: Secondary | ICD-10-CM | POA: Insufficient documentation

## 2019-12-06 DIAGNOSIS — N838 Other noninflammatory disorders of ovary, fallopian tube and broad ligament: Secondary | ICD-10-CM | POA: Diagnosis not present

## 2019-12-06 DIAGNOSIS — H409 Unspecified glaucoma: Secondary | ICD-10-CM | POA: Insufficient documentation

## 2019-12-06 DIAGNOSIS — F329 Major depressive disorder, single episode, unspecified: Secondary | ICD-10-CM | POA: Diagnosis not present

## 2019-12-06 DIAGNOSIS — N83209 Unspecified ovarian cyst, unspecified side: Secondary | ICD-10-CM

## 2019-12-06 HISTORY — PX: XI ROBOTIC ASSISTED OOPHORECTOMY: SHX6823

## 2019-12-06 LAB — PREGNANCY, URINE: Preg Test, Ur: NEGATIVE

## 2019-12-06 SURGERY — OOPHORECTOMY, ROBOT-ASSISTED
Anesthesia: General | Site: Abdomen

## 2019-12-06 MED ORDER — ROCURONIUM BROMIDE 10 MG/ML (PF) SYRINGE
PREFILLED_SYRINGE | INTRAVENOUS | Status: AC
  Filled 2019-12-06: qty 10

## 2019-12-06 MED ORDER — ONDANSETRON HCL 4 MG/2ML IJ SOLN
INTRAMUSCULAR | Status: DC | PRN
  Administered 2019-12-06: 4 mg via INTRAVENOUS

## 2019-12-06 MED ORDER — PHENYLEPHRINE 40 MCG/ML (10ML) SYRINGE FOR IV PUSH (FOR BLOOD PRESSURE SUPPORT)
PREFILLED_SYRINGE | INTRAVENOUS | Status: DC | PRN
  Administered 2019-12-06: 80 ug via INTRAVENOUS

## 2019-12-06 MED ORDER — DEXAMETHASONE SODIUM PHOSPHATE 4 MG/ML IJ SOLN: 4.0000 mg | INTRAMUSCULAR | Status: DC

## 2019-12-06 MED ORDER — PROPOFOL 10 MG/ML IV BOLUS
INTRAVENOUS | Status: DC | PRN
  Administered 2019-12-06: 200 mg via INTRAVENOUS

## 2019-12-06 MED ORDER — KETAMINE HCL 10 MG/ML IJ SOLN
INTRAMUSCULAR | Status: DC | PRN
  Administered 2019-12-06: 50 mg via INTRAVENOUS

## 2019-12-06 MED ORDER — LACTATED RINGERS IR SOLN
Status: DC | PRN
  Administered 2019-12-06: 1

## 2019-12-06 MED ORDER — OXYCODONE HCL 5 MG/5ML PO SOLN: 5.0000 mg | Freq: Once | ORAL | Status: DC | PRN

## 2019-12-06 MED ORDER — PROPOFOL 10 MG/ML IV BOLUS
INTRAVENOUS | Status: AC
  Filled 2019-12-06: qty 20

## 2019-12-06 MED ORDER — PROMETHAZINE HCL 25 MG/ML IJ SOLN: 6.2500 mg | INTRAMUSCULAR | Status: DC | PRN

## 2019-12-06 MED ORDER — ORAL CARE MOUTH RINSE: 15.0000 mL | Freq: Once | OROMUCOSAL | Status: AC

## 2019-12-06 MED ORDER — LIDOCAINE 2% (20 MG/ML) 5 ML SYRINGE
INTRAMUSCULAR | Status: AC
  Filled 2019-12-06: qty 5

## 2019-12-06 MED ORDER — MIDAZOLAM HCL 2 MG/2ML IJ SOLN
INTRAMUSCULAR | Status: AC
  Filled 2019-12-06: qty 2

## 2019-12-06 MED ORDER — SUGAMMADEX SODIUM 500 MG/5ML IV SOLN
INTRAVENOUS | Status: AC
  Filled 2019-12-06: qty 5

## 2019-12-06 MED ORDER — LIDOCAINE 2% (20 MG/ML) 5 ML SYRINGE
INTRAMUSCULAR | Status: DC | PRN
Start: 2019-12-06 — End: 2019-12-06
  Administered 2019-12-06: 1.5 mg/kg/h via INTRAVENOUS

## 2019-12-06 MED ORDER — ACETAMINOPHEN 650 MG RE SUPP
650.0000 mg | RECTAL | Status: DC | PRN
  Filled 2019-12-06: qty 1

## 2019-12-06 MED ORDER — SODIUM CHLORIDE 0.9 % IV SOLN: 250.0000 mL | INTRAVENOUS | Status: DC | PRN

## 2019-12-06 MED ORDER — SCOPOLAMINE 1 MG/3DAYS TD PT72
1.0000 | MEDICATED_PATCH | TRANSDERMAL | Status: DC
  Administered 2019-12-06: 1.5 mg via TRANSDERMAL
  Filled 2019-12-06: qty 1

## 2019-12-06 MED ORDER — FENTANYL CITRATE (PF) 100 MCG/2ML IJ SOLN
INTRAMUSCULAR | Status: AC
  Filled 2019-12-06: qty 2

## 2019-12-06 MED ORDER — ACETAMINOPHEN 325 MG PO TABS: 650.0000 mg | ORAL_TABLET | ORAL | Status: DC | PRN

## 2019-12-06 MED ORDER — DEXAMETHASONE SODIUM PHOSPHATE 10 MG/ML IJ SOLN
INTRAMUSCULAR | Status: DC | PRN
  Administered 2019-12-06: 10 mg via INTRAVENOUS

## 2019-12-06 MED ORDER — OXYCODONE HCL 5 MG PO TABS: 5.0000 mg | ORAL_TABLET | Freq: Once | ORAL | Status: DC | PRN

## 2019-12-06 MED ORDER — EPHEDRINE SULFATE-NACL 50-0.9 MG/10ML-% IV SOSY
PREFILLED_SYRINGE | INTRAVENOUS | Status: DC | PRN
  Administered 2019-12-06: 10 mg via INTRAVENOUS

## 2019-12-06 MED ORDER — SUGAMMADEX SODIUM 200 MG/2ML IV SOLN
INTRAVENOUS | Status: DC | PRN
  Administered 2019-12-06: 250 mg via INTRAVENOUS

## 2019-12-06 MED ORDER — SODIUM CHLORIDE 0.9% FLUSH: 3.0000 mL | Freq: Two times a day (BID) | INTRAVENOUS | Status: DC

## 2019-12-06 MED ORDER — LIDOCAINE HCL 2 % IJ SOLN
INTRAMUSCULAR | Status: AC
  Filled 2019-12-06: qty 20

## 2019-12-06 MED ORDER — DEXAMETHASONE SODIUM PHOSPHATE 10 MG/ML IJ SOLN
INTRAMUSCULAR | Status: AC
  Filled 2019-12-06: qty 1

## 2019-12-06 MED ORDER — BUPIVACAINE HCL 0.25 % IJ SOLN
INTRAMUSCULAR | Status: AC
  Filled 2019-12-06: qty 1

## 2019-12-06 MED ORDER — CELECOXIB 200 MG PO CAPS
400.0000 mg | ORAL_CAPSULE | ORAL | Status: AC
  Administered 2019-12-06: 400 mg via ORAL
  Filled 2019-12-06: qty 2

## 2019-12-06 MED ORDER — MIDAZOLAM HCL 2 MG/2ML IJ SOLN
INTRAMUSCULAR | Status: DC | PRN
  Administered 2019-12-06: 2 mg via INTRAVENOUS

## 2019-12-06 MED ORDER — ACETAMINOPHEN 500 MG PO TABS
1000.0000 mg | ORAL_TABLET | ORAL | Status: AC
  Administered 2019-12-06: 1000 mg via ORAL
  Filled 2019-12-06: qty 2

## 2019-12-06 MED ORDER — EPHEDRINE 5 MG/ML INJ
INTRAVENOUS | Status: AC
  Filled 2019-12-06: qty 10

## 2019-12-06 MED ORDER — OXYCODONE HCL 5 MG PO TABS: 5.0000 mg | ORAL_TABLET | ORAL | Status: DC | PRN

## 2019-12-06 MED ORDER — GABAPENTIN 300 MG PO CAPS
300.0000 mg | ORAL_CAPSULE | ORAL | Status: AC
  Administered 2019-12-06: 300 mg via ORAL
  Filled 2019-12-06: qty 1

## 2019-12-06 MED ORDER — SODIUM CHLORIDE 0.9% FLUSH: 3.0000 mL | INTRAVENOUS | Status: DC | PRN

## 2019-12-06 MED ORDER — LACTATED RINGERS IV SOLN: INTRAVENOUS | Status: DC

## 2019-12-06 MED ORDER — KETOROLAC TROMETHAMINE 30 MG/ML IJ SOLN: 30.0000 mg | Freq: Once | INTRAMUSCULAR | Status: DC | PRN

## 2019-12-06 MED ORDER — BUPIVACAINE HCL 0.25 % IJ SOLN
INTRAMUSCULAR | Status: DC | PRN
  Administered 2019-12-06: 50 mL

## 2019-12-06 MED ORDER — MORPHINE SULFATE (PF) 4 MG/ML IV SOLN: 2.0000 mg | INTRAVENOUS | Status: DC | PRN

## 2019-12-06 MED ORDER — FENTANYL CITRATE (PF) 100 MCG/2ML IJ SOLN
25.0000 ug | INTRAMUSCULAR | Status: DC | PRN
  Administered 2019-12-06 (×2): 50 ug via INTRAVENOUS

## 2019-12-06 MED ORDER — ROCURONIUM BROMIDE 10 MG/ML (PF) SYRINGE
PREFILLED_SYRINGE | INTRAVENOUS | Status: DC | PRN
  Administered 2019-12-06: 60 mg via INTRAVENOUS

## 2019-12-06 MED ORDER — STERILE WATER FOR IRRIGATION IR SOLN
Status: DC | PRN
  Administered 2019-12-06: 1000 mL

## 2019-12-06 MED ORDER — HEPARIN SODIUM (PORCINE) 5000 UNIT/ML IJ SOLN
5000.0000 [IU] | INTRAMUSCULAR | Status: AC
  Administered 2019-12-06: 5000 [IU] via SUBCUTANEOUS
  Filled 2019-12-06: qty 1

## 2019-12-06 MED ORDER — FENTANYL CITRATE (PF) 250 MCG/5ML IJ SOLN
INTRAMUSCULAR | Status: AC
  Filled 2019-12-06: qty 5

## 2019-12-06 MED ORDER — ONDANSETRON HCL 4 MG/2ML IJ SOLN
INTRAMUSCULAR | Status: AC
  Filled 2019-12-06: qty 2

## 2019-12-06 MED ORDER — PHENYLEPHRINE 40 MCG/ML (10ML) SYRINGE FOR IV PUSH (FOR BLOOD PRESSURE SUPPORT)
PREFILLED_SYRINGE | INTRAVENOUS | Status: AC
  Filled 2019-12-06: qty 10

## 2019-12-06 MED ORDER — FENTANYL CITRATE (PF) 250 MCG/5ML IJ SOLN
INTRAMUSCULAR | Status: DC | PRN
  Administered 2019-12-06: 150 ug via INTRAVENOUS

## 2019-12-06 MED ORDER — LIDOCAINE 2% (20 MG/ML) 5 ML SYRINGE
INTRAMUSCULAR | Status: DC | PRN
  Administered 2019-12-06: 100 mg via INTRAVENOUS

## 2019-12-06 MED ORDER — CHLORHEXIDINE GLUCONATE 0.12 % MT SOLN
15.0000 mL | Freq: Once | OROMUCOSAL | Status: AC
  Administered 2019-12-06: 15 mL via OROMUCOSAL

## 2019-12-06 SURGICAL SUPPLY — 72 items
ADH SKN CLS APL DERMABOND .7 (GAUZE/BANDAGES/DRESSINGS) ×1
AGENT HMST KT MTR STRL THRMB (HEMOSTASIS)
APL ESCP 34 STRL LF DISP (HEMOSTASIS)
APPLICATOR SURGIFLO ENDO (HEMOSTASIS) IMPLANT
BACTOSHIELD CHG 4% 4OZ (MISCELLANEOUS) ×1
BAG LAPAROSCOPIC 12 15 PORT 16 (BASKET) IMPLANT
BAG RETRIEVAL 12/15 (BASKET)
BAG RETRIEVAL 12/15MM (BASKET)
BAG SPEC RTRVL LRG 6X4 10 (ENDOMECHANICALS) ×1
BLADE SURG SZ10 CARB STEEL (BLADE) IMPLANT
COVER BACK TABLE 60X90IN (DRAPES) ×3 IMPLANT
COVER TIP SHEARS 8 DVNC (MISCELLANEOUS) ×1 IMPLANT
COVER TIP SHEARS 8MM DA VINCI (MISCELLANEOUS) ×3
COVER WAND RF STERILE (DRAPES) IMPLANT
DECANTER SPIKE VIAL GLASS SM (MISCELLANEOUS) IMPLANT
DERMABOND ADVANCED (GAUZE/BANDAGES/DRESSINGS) ×2
DERMABOND ADVANCED .7 DNX12 (GAUZE/BANDAGES/DRESSINGS) ×1 IMPLANT
DRAPE ARM DVNC X/XI (DISPOSABLE) ×4 IMPLANT
DRAPE COLUMN DVNC XI (DISPOSABLE) ×1 IMPLANT
DRAPE DA VINCI XI ARM (DISPOSABLE) ×12
DRAPE DA VINCI XI COLUMN (DISPOSABLE) ×3
DRAPE SHEET LG 3/4 BI-LAMINATE (DRAPES) ×3 IMPLANT
DRAPE SURG IRRIG POUCH 19X23 (DRAPES) ×3 IMPLANT
DRSG OPSITE POSTOP 4X6 (GAUZE/BANDAGES/DRESSINGS) IMPLANT
DRSG OPSITE POSTOP 4X8 (GAUZE/BANDAGES/DRESSINGS) IMPLANT
ELECT REM PT RETURN 15FT ADLT (MISCELLANEOUS) ×3 IMPLANT
GLOVE BIO SURGEON STRL SZ 6 (GLOVE) ×12 IMPLANT
GLOVE BIO SURGEON STRL SZ 6.5 (GLOVE) ×4 IMPLANT
GLOVE BIO SURGEONS STRL SZ 6.5 (GLOVE) ×2
GOWN STRL REUS W/ TWL LRG LVL3 (GOWN DISPOSABLE) ×4 IMPLANT
GOWN STRL REUS W/TWL LRG LVL3 (GOWN DISPOSABLE) ×12
HOLDER FOLEY CATH W/STRAP (MISCELLANEOUS) ×3 IMPLANT
IRRIG SUCT STRYKERFLOW 2 WTIP (MISCELLANEOUS) ×3
IRRIGATION SUCT STRKRFLW 2 WTP (MISCELLANEOUS) ×1 IMPLANT
KIT PROCEDURE DA VINCI SI (MISCELLANEOUS)
KIT PROCEDURE DVNC SI (MISCELLANEOUS) IMPLANT
KIT TURNOVER KIT A (KITS) IMPLANT
MANIPULATOR UTERINE 4.5 ZUMI (MISCELLANEOUS) ×3 IMPLANT
NDL HYPO 21X1.5 SAFETY (NEEDLE) ×1 IMPLANT
NDL SPNL 18GX3.5 QUINCKE PK (NEEDLE) IMPLANT
NEEDLE HYPO 21X1.5 SAFETY (NEEDLE) ×3 IMPLANT
NEEDLE SPNL 18GX3.5 QUINCKE PK (NEEDLE) IMPLANT
OBTURATOR OPTICAL STANDARD 8MM (TROCAR) ×3
OBTURATOR OPTICAL STND 8 DVNC (TROCAR) ×1
OBTURATOR OPTICALSTD 8 DVNC (TROCAR) ×1 IMPLANT
PACK ROBOT GYN WLCUSTOM (TRAY / TRAY PROCEDURE) ×3 IMPLANT
PAD POSITIONING PINK XL (MISCELLANEOUS) ×3 IMPLANT
PENCIL SMOKE EVACUATOR (MISCELLANEOUS) IMPLANT
PORT ACCESS TROCAR AIRSEAL 12 (TROCAR) ×1 IMPLANT
PORT ACCESS TROCAR AIRSEAL 5M (TROCAR) ×2
POUCH SPECIMEN RETRIEVAL 10MM (ENDOMECHANICALS) ×2 IMPLANT
SCRUB CHG 4% DYNA-HEX 4OZ (MISCELLANEOUS) ×2 IMPLANT
SEAL CANN UNIV 5-8 DVNC XI (MISCELLANEOUS) ×3 IMPLANT
SEAL XI 5MM-8MM UNIVERSAL (MISCELLANEOUS) ×9
SET TRI-LUMEN FLTR TB AIRSEAL (TUBING) ×3 IMPLANT
SPONGE LAP 18X18 RF (DISPOSABLE) IMPLANT
SURGIFLO W/THROMBIN 8M KIT (HEMOSTASIS) IMPLANT
SUT MNCRL AB 4-0 PS2 18 (SUTURE) IMPLANT
SUT PDS AB 1 TP1 96 (SUTURE) IMPLANT
SUT VIC AB 0 CT1 27 (SUTURE)
SUT VIC AB 0 CT1 27XBRD ANTBC (SUTURE) IMPLANT
SUT VIC AB 2-0 CT1 27 (SUTURE)
SUT VIC AB 2-0 CT1 TAPERPNT 27 (SUTURE) IMPLANT
SUT VICRYL 4-0 PS2 18IN ABS (SUTURE) ×6 IMPLANT
SYR 10ML LL (SYRINGE) IMPLANT
TOWEL OR NON WOVEN STRL DISP B (DISPOSABLE) ×3 IMPLANT
TRAP SPECIMEN MUCUS 40CC (MISCELLANEOUS) IMPLANT
TRAY FOLEY MTR SLVR 16FR STAT (SET/KITS/TRAYS/PACK) ×3 IMPLANT
TROCAR XCEL NON-BLD 5MMX100MML (ENDOMECHANICALS) IMPLANT
UNDERPAD 30X36 HEAVY ABSORB (UNDERPADS AND DIAPERS) ×3 IMPLANT
WATER STERILE IRR 1000ML POUR (IV SOLUTION) ×3 IMPLANT
YANKAUER SUCT BULB TIP 10FT TU (MISCELLANEOUS) IMPLANT

## 2019-12-06 NOTE — Transfer of Care (Signed)
Immediate Anesthesia Transfer of Care Note  Patient: [name]  Procedure(s) Performed: XI ROBOTIC ASSISTED UNILATERAL OOPHORECTOMY, BILATERAL SALPINGECTOMY, (N/A Abdomen)  Patient Location: PACU  Anesthesia Type:General  Level of Consciousness: drowsy  Airway & Oxygen Therapy: Patient Spontanous Breathing and Patient connected to face mask oxygen  Post-op Assessment: Report given to RN and Post -op Vital signs reviewed and stable  Post vital signs: Reviewed and stable  Last Vitals:  Vitals Value Taken Time  BP 139/82 12/06/19 1249  Temp 37.1 C 12/06/19 1249  Pulse 89 12/06/19 1250  Resp 18 12/06/19 1250  SpO2 98 % 12/06/19 1250  Vitals shown include unvalidated device data.  Last Pain:  Vitals:   12/06/19 1249  TempSrc:   PainSc: Asleep      Patients Stated Pain Goal: 4 (12/06/19 0939)  Complications: No apparent anesthesia complications

## 2019-12-06 NOTE — Anesthesia Postprocedure Evaluation (Signed)
Anesthesia Post Note  Patient: [name]  Procedure(s) Performed: XI ROBOTIC ASSISTED UNILATERAL OOPHORECTOMY, BILATERAL SALPINGECTOMY, (N/A Abdomen)     Patient location during evaluation: PACU Anesthesia Type: General Level of consciousness: awake and alert Pain management: pain level controlled Vital Signs Assessment: post-procedure vital signs reviewed and stable Respiratory status: spontaneous breathing, nonlabored ventilation and respiratory function stable Cardiovascular status: blood pressure returned to baseline and stable Postop Assessment: no apparent nausea or vomiting Anesthetic complications: no    Last Vitals:  Vitals:   12/06/19 1330 12/06/19 1410  BP: 101/89 139/84  Pulse: 88   Resp: 10 16  Temp:  37 C  SpO2: 96% 100%    Last Pain:  Vitals:   12/06/19 1330  TempSrc:   PainSc: Asleep                 Beryle Lathe

## 2019-12-06 NOTE — Op Note (Signed)
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   Anesthesia: GET  Urine Output: 50 cc, cloudy and dark in appearance, Urine culture sent  Operative Findings: On EUA, some fullness in the cul de sac, normal uterus. No nodularity. On intra-abdominal entry, normal upper abdomen. Omentum adherent to the anterior abdominal wall in the left mid and upper quadrant. Uterus 8cm and normal in appearance. Normal left adnexa. Enlarged right ovary with clear and simple appearing cyst measuring 2-3 cm. Clear cyst fluid on drainage. 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 NO free fluid. NO intra-abdominal or pelvic evidence of disease.  Estimated Blood Loss:  less than 50 mL      Total IV Fluids: see I&O flowsheet         Specimens: bilateral tubes, right ovary         Complications:  None apparent; patient tolerated the procedure well.         Disposition: PACU - hemodynamically stable.  Procedure Details  The patient was seen in the Holding Room. The risks, benefits, complications, treatment options, and expected outcomes were discussed with the patient.  The patient concurred with the proposed plan, giving informed consent.  The site of surgery properly noted/marked. The patient was identified as [name] and the procedure verified as a Robotic-assisted bilateral salpingectomy and unilateral oophorectomy.   After induction of  anesthesia, the patient was draped and prepped in the usual sterile manner. Patient was placed in supine position after anesthesia and draped and prepped in the usual sterile manner as follows: Her arms were tucked to her side with all appropriate precautions.  The shoulders were stabilized with padded shoulder blocks applied to the acromium processes.  The patient was placed in the semi-lithotomy position in [place] stirrups.  The perineum and vagina were prepped with CholoraPrep. The patient was draped after the CholoraPrep had been allowed to dry for 3 minutes.  A Time Out was held and the above information confirmed.  The urethra was prepped with Betadine. Foley catheter was placed.  A sterile speculum was placed in the vagina.  The cervix was grasped with a single-tooth tenaculum. OG tube placement was confirmed and to suction.   Next, a 10 mm skin incision was made 1 cm below the subcostal margin in the midclavicular line.  The 5 mm Optiview port and scope was used for direct entry.  Opening pressure was under 10 mm CO2.  The abdomen was insufflated and the findings were noted as above.   At this point and all points during the procedure, the patient's intra-abdominal pressure did not exceed 15 mmHg. Next, an 8 mm skin incision was made superior to the umbilicus and a right and left port were placed about 8 cm lateral to the robot port on the right and left side.  The 5 mm assist trocar and a 72mm incision was made in the right upper quadrants given omental adhesions. A 10-12 mm port was placed. All ports were placed under direct visualization.  The patient was placed in steep Trendelenburg.  Bowel was  already in the upper abdomen.  The robot was docked in the normal manner.  No washings collected given given appearance grossly of the right ovary. The right fallopian tube was elevated and monopolar and bipolar electrocautery were used to cauterize and transect along the mesosalpinx up to the uterine  cornua. The tube was then auterized and transected, freeing the tube which was handed out the assist port. The same procedure was performed on the left. The right ovary cyst was ruptured and blunt dissection and electrocautery were used to excise the cyst wall. Upon this dissection, some yellow/orange stromal tissue was noted. Given low but present risk for steroid cell tumor, decision was made to proceed with oophorectomy on this side. The right peritoneum was opened parallel to the IP ligament to open the retroperitoneal space. The ureter was noted to be on the medial leaf of the broad ligament.  The peritoneum above the ureter was incised and stretched and the infundibulopelvic ligament was skeletonized, cauterized and cut. The broad ligament was cauterized to just lateral to the uterus and the utero-ovarian ligament was isolated, cauterized and transected. The right ovary was placed in an Endocatch bag and removed through the assist port.    Irrigation was used and excellent hemostasis was achieved.  At this point in the procedure was completed.  Robotic instruments were removed under direct visulaization.  The robot was undocked. The fascia at the 10-12 mm port was closed with 0 Vicryl on a UR-5 needle.  The subcuticular tissue was closed with 4-0 Vicryl and the skin was closed with 4-0 Monocryl in a subcuticular manner.  Dermabond was applied.    The vagina was swabbed with minimal bleeding noted.   All sponge, lap and needle counts were correct x  3. Foley catheter was removed.   The patient was transferred to the recovery room in stable condition.  Jeral Pinch, MD

## 2019-12-06 NOTE — Anesthesia Procedure Notes (Signed)
Procedure Name: Intubation Date/Time: 12/06/2019 11:25 AM Performed by: Sharlette Dense, CRNA Patient Re-evaluated:Patient Re-evaluated prior to induction Oxygen Delivery Method: Circle system utilized Preoxygenation: Pre-oxygenation with 100% oxygen Induction Type: IV induction Ventilation: Mask ventilation without difficulty and Oral airway inserted - appropriate to patient size Laryngoscope Size: Mac and 3 Grade View: Grade I Tube type: Oral Tube size: 7.5 mm Number of attempts: 1 Airway Equipment and Method: Stylet Placement Confirmation: ETT inserted through vocal cords under direct vision,  positive ETCO2 and breath sounds checked- equal and bilateral Secured at: 21 cm Tube secured with: Tape Dental Injury: Teeth and Oropharynx as per pre-operative assessment  Comments: Intubation performed by Delana Meyer, EMT

## 2019-12-06 NOTE — Anesthesia Preprocedure Evaluation (Addendum)
Anesthesia Evaluation  Patient identified by MRN, date of birth, ID band Patient awake    Reviewed: Allergy & Precautions, NPO status , Patient's Chart, lab work & pertinent test results  History of Anesthesia Complications Negative for: history of anesthetic complications  Airway Mallampati: II  TM Distance: >3 FB Neck ROM: Full    Dental  (+) Dental Advisory Given, Teeth Intact   Pulmonary neg pulmonary ROS,    Pulmonary exam normal        Cardiovascular negative cardio ROS Normal cardiovascular exam     Neuro/Psych  Headaches, PSYCHIATRIC DISORDERS Anxiety Depression    GI/Hepatic negative GI ROS, Neg liver ROS,   Endo/Other   Obesity   Renal/GU negative Renal ROS     Musculoskeletal negative musculoskeletal ROS (+)   Abdominal   Peds  Hematology negative hematology ROS (+)   Anesthesia Other Findings Covid neg 6/4   Reproductive/Obstetrics  Pelvic mass                             Anesthesia Physical Anesthesia Plan  ASA: II  Anesthesia Plan: General   Post-op Pain Management:    Induction: Intravenous  PONV Risk Score and Plan: 4 or greater and Treatment may vary due to age or medical condition, Ondansetron, Scopolamine patch - Pre-op, Dexamethasone and Midazolam  Airway Management Planned: Oral ETT  Additional Equipment: None  Intra-op Plan:   Post-operative Plan: Extubation in OR  Informed Consent: I have reviewed the patients History and Physical, chart, labs and discussed the procedure including the risks, benefits and alternatives for the proposed anesthesia with the patient or authorized representative who has indicated his/her understanding and acceptance.     Dental advisory given  Plan Discussed with: CRNA and Anesthesiologist  Anesthesia Plan Comments:        Anesthesia Quick Evaluation

## 2019-12-06 NOTE — Interval H&P Note (Signed)
History and Physical Interval Note:  12/06/2019 10:41 AM  [name]  has presented today for surgery, with the diagnosis of OVARIAN CYST.  The various methods of treatment have been discussed with the patient and family. After consideration of risks, benefits and other options for treatment, the patient has consented to  Procedure(s): XI ROBOTIC ASSISTED UNILATERAL OOPHORECTOMY, BILATERAL SALPINGECTOMY, STAGING (N/A) as a surgical intervention.  The patient's history has been reviewed, patient examined, no change in status, stable for surgery.  I have reviewed the patient's chart and labs.  Questions were answered to the patient's satisfaction.     Carver Fila

## 2019-12-06 NOTE — Discharge Instructions (Signed)
Unilateral Salpingo-Oophorectomy, Care After This sheet gives you information about how to care for yourself after your procedure. Your health care provider may also give you more specific instructions. If you have problems or questions, contact your health care provider. What can I expect after the procedure? After the procedure, it is common to have:  Abdominal pain.  Some occasional vaginal bleeding (spotting).  Tiredness. Follow these instructions at home: Incision care   Keep your incision area and your bandage (dressing) clean and dry.  Follow instructions from your health care provider about how to take care of your incision. Make sure you: ? Wash your hands with soap and water before you change your dressing. If soap and water are not available, use hand sanitizer. ? Change your dressing as told by your health care provider. ? Leave stitches (sutures), staples, skin glue, or adhesive strips in place. These skin closures may need to stay in place for 2 weeks or longer. If adhesive strip edges start to loosen and curl up, you may trim the loose edges. Do not remove adhesive strips completely unless your health care provider tells you to do that.  Check your incision area every day for signs of infection. Check for: ? Redness, swelling, or pain. ? Fluid or blood. ? Warmth. ? Pus or a bad smell. Activity  Do not drive or use heavy machinery while taking prescription pain medicine.  Do not drive for 24 hours if you received a medicine to help you relax (sedative).  Take frequent, short walks throughout the day. Rest when you get tired. Ask your health care provider what activities are safe for you.  Avoid activities that require great effort. Also, avoid heavy lifting. Do not lift anything that is heavier than 5 lb (2.3 kg), or the limit that your health care provider tells you, until he or she says that it is safe to do so.  Do not douche, use tampons, or have sex until your  health care provider approves. General instructions  To prevent or treat constipation while you are taking prescription pain medicine, your health care provider may recommend that you: ? Drink enough fluid to keep your urine pale yellow. ? Take over-the-counter or prescription medicines. ? Eat foods that are high in fiber, such as fresh fruits and vegetables, whole grains, and beans. ? Limit foods that are high in fat and processed sugars, such as fried and sweet foods.  Take over-the-counter and prescription medicines only as told by your health care provider.  Do not take baths, swim, or use a hot tub until your health care provider approves. Ask your health care provider if you may take showers. You may only be allowed to take sponge baths.  Wear compression stockings as told by your health care provider. These stockings help to prevent blood clots and reduce swelling in your legs.  Keep all follow-up visits as told by your health care provider. This is important. Contact a health care provider if:  You have pain when you urinate.  You have pus or a bad smelling discharge coming from your vagina.  You have redness, swelling, or pain around your incision.  You have fluid or blood coming from your incision.  Your incision feels warm to the touch.  You have pus or a bad smell coming from your incision.  You have a fever.  Your incision starts to break open.  You have abdominal pain that gets worse or does not get better with medicine.    You develop a rash.  You develop nausea and vomiting.  You feel lightheaded. Get help right away if:  You develop pain in your chest or leg.  You develop shortness of breath.  You faint.  You have increased bleeding from your vagina. Summary  After the procedure, it is common to have pain, tiredness, and occasional bleeding from the vagina.  Follow instructions from your health care provider about how to take care of your  incision.  Check your incision every day for signs of infection and report any symptoms to your health care provider.  Follow instructions from your health care provider about activities and restrictions. This information is not intended to replace advice given to you by your health care provider. Make sure you discuss any questions you have with your health care provider. Document Revised: 05/29/2017 Document Reviewed: 09/25/2016 Elsevier Patient Education  2020 [employer].   Return to work: 1 week  Activity: 1. Be up and out of the bed during the day.  Take a nap if needed.  You may walk up steps but be careful and use the hand rail.  Stair climbing will tire you more than you think, you may need to stop part way and rest.   2. No lifting or straining for 4 weeks.  3. No driving for 1 weeks.  Do Not drive if you are taking narcotic pain medicine.  4. Shower daily.  Use soap and water on your incision and pat dry; don't rub.   5. No sexual activity and nothing in the vagina for 8 weeks.  Medications:  - Take ibuprofen and tylenol first line for pain control. Take these regularly (every 6 hours) to decrease the build up of pain.  - If necessary, for severe pain not relieved by ibuprofen, contact Dr Winferd Humphrey office and you will be prescribed percocet.  - While taking percocet you should take sennakot every night to reduce the likelihood of constipation. If this causes diarrhea, stop its use.  Diet: 1. Low sodium Heart Healthy Diet is recommended.  2. It is safe to use a laxative if you have difficulty moving your bowels.   Wound Care: 1. Keep clean and dry.  Shower daily.  Reasons to call the Doctor:   Fever - Oral temperature greater than 100.4 degrees Fahrenheit  Foul-smelling vaginal discharge  Difficulty urinating  Nausea and vomiting  Increased pain at the site of the incision that is unrelieved with pain medicine.  Difficulty breathing with or without chest  pain  New calf pain especially if only on one side  Sudden, continuing increased vaginal bleeding with or without clots.   Follow-up: 1. See Eugene Garnet  in 4 weeks.  Contacts: For questions or concerns you should contact:  Dr. Eugene Garnet at [phone] After hours and on week-ends call [phone] and ask to speak to the physician on call for Gynecologic Oncology

## 2019-12-07 ENCOUNTER — Encounter: Payer: Self-pay | Admitting: *Deleted

## 2019-12-07 ENCOUNTER — Telehealth: Payer: Self-pay

## 2019-12-07 LAB — URINE CULTURE: Culture: NO GROWTH

## 2019-12-07 LAB — SURGICAL PATHOLOGY

## 2019-12-07 NOTE — Telephone Encounter (Signed)
Ms Melton-Mickles  states that she is eating,drinking, and urinating well. She is passing gas.  Encouraged her to get senokot-s and take 2 tabs bid to help get bowels moving. She is using the percocet for pain that she had from her recent kidney stone. She is using prn. The last dose was 0530 today and her pain is a 2/10. Pt declined to have Ibuprofen sent to pharmacy  As well as tylenol.  She does not like to take medications. Pt asked about exercising.  Told her she needed to wait 10 days prior to doing yoga.  She can walk and gradually increase activity.  Suggested she refer to her discharge papers from 12-06-19 Afebrile. Incisions are D&I. Pt aware of the office number [phone] to call if she has any questions or concerns as well as her f/u appointment with Dr. Pricilla Holm 12-26-19 at 1330.  Told Ms Melton-Mickles that her pathology showed no Cancer per Dr. Pricilla Holm.

## 2019-12-16 ENCOUNTER — Encounter: Payer: Self-pay | Admitting: Physician Assistant

## 2019-12-19 ENCOUNTER — Encounter: Payer: Self-pay | Admitting: Physician Assistant

## 2019-12-26 ENCOUNTER — Other Ambulatory Visit: Payer: Self-pay

## 2019-12-26 ENCOUNTER — Inpatient Hospital Stay: Payer: BC Managed Care – PPO | Attending: Gynecologic Oncology | Admitting: Gynecologic Oncology

## 2019-12-26 ENCOUNTER — Encounter: Payer: Self-pay | Admitting: Gynecologic Oncology

## 2019-12-26 VITALS — BP 114/96 | HR 95 | Temp 99.4°F | Resp 18 | Ht 67.0 in | Wt 240.5 lb

## 2019-12-26 DIAGNOSIS — N83201 Unspecified ovarian cyst, right side: Secondary | ICD-10-CM | POA: Insufficient documentation

## 2019-12-26 DIAGNOSIS — Z90721 Acquired absence of ovaries, unilateral: Secondary | ICD-10-CM | POA: Insufficient documentation

## 2019-12-26 DIAGNOSIS — N83209 Unspecified ovarian cyst, unspecified side: Secondary | ICD-10-CM

## 2019-12-26 NOTE — Patient Instructions (Signed)
You are healing well from surgery!  Remember that your lifting restrictions of no more than 10 pounds are in place until you are 6 weeks out from surgery.  Please call if you have any questions or concerns at [phone].    You may follow-up with your regular gynecologist as needed for any future gynecologic care and issues.

## 2019-12-26 NOTE — Progress Notes (Signed)
Gynecologic Oncology Return Clinic Visit  12/26/19  Reason for Visit: post-op follow-up  Treatment History: 11/11/19: presented to ED with abdominal pain, N/V. On CT, noted to have ureteral stone as well as a 4cm complex right adnexal mass. Normal CA-125 and CEA. 12/06/19: Robotic BS, right oophorectomy. Final pathology - benign ovarian cyst, no malignancy  Interval History: Patient reports overall doing well since surgery.  She denies any significant abdominal or pelvic pain.  She is continue to have some constipation and has not been taking stool softeners regularly.  She endorses normal bladder function.  She thinks she has had a menses since surgery.  She denies any fevers or chills.  She is worried that she may be developing a keloid at the site of one of her incisions.  Past Medical/Surgical History: Past Medical History:  Diagnosis Date  . Acid indigestion    "sometimes"  . Anxiety   . Depression   . Gallstones   . Glaucoma   . Headache    "sometimes"  . History of kidney stones   . Obesity (BMI 30-39.9)   . Pelvic mass     Past Surgical History:  Procedure Laterality Date  . CESAREAN SECTION    . CHOLECYSTECTOMY N/A 11/15/2015   Procedure: LAPAROSCOPIC CHOLECYSTECTOMY WITH INTRAOPERATIVE CHOLANGIOGRAM;  Surgeon: Gaynelle Adu, MD;  Location: [facility] OR;  Service: General;  Laterality: N/A;  . dental implant     March 2019  . FOOT SURGERY     Bilateral  . lasic    . TOOTH EXTRACTION    . WISDOM TOOTH EXTRACTION    . XI ROBOTIC ASSISTED OOPHORECTOMY N/A 12/06/2019   Procedure: XI ROBOTIC ASSISTED UNILATERAL OOPHORECTOMY, BILATERAL SALPINGECTOMY,;  Surgeon: Carver Fila, MD;  Location: [facility] ORS;  Service: Gynecology;  Laterality: N/A;    Family History  Problem Relation Age of Onset  . Healthy Mother        Living  . Healthy Father        Living  . Breast cancer Maternal Grandmother   . Stroke Paternal Grandmother   . Diabetes Maternal Uncle   . Kidney disease  Maternal Uncle   . Hyperlipidemia Paternal Uncle   . Hypertension Paternal Uncle   . Healthy Brother   . Healthy Sister   . Healthy Daughter        x2  . Ovarian cancer Neg Hx   . Uterine cancer Neg Hx   . Colon cancer Neg Hx     Social History   Socioeconomic History  . Marital status: Single    Spouse name: Not on file  . Number of children: Not on file  . Years of education: Not on file  . Highest education level: Not on file  Occupational History  . Not on file  Tobacco Use  . Smoking status: Never Smoker  . Smokeless tobacco: Never Used  Vaping Use  . Vaping Use: Never used  Substance and Sexual Activity  . Alcohol use: No  . Drug use: No  . Sexual activity: Never  Other Topics Concern  . Not on file  Social History Narrative  . Not on file   Social Determinants of Health   Financial Resource Strain:   . Difficulty of Paying Living Expenses:   Food Insecurity:   . Worried About Programme researcher, broadcasting/film/video in the Last Year:   . Barista in the Last Year:   Transportation Needs:   . Freight forwarder (Medical):   Marland Kitchen  Lack of Transportation (Non-Medical):   Physical Activity:   . Days of Exercise per Week:   . Minutes of Exercise per Session:   Stress:   . Feeling of Stress :   Social Connections:   . Frequency of Communication with Friends and Family:   . Frequency of Social Gatherings with Friends and Family:   . Attends Religious Services:   . Active Member of Clubs or Organizations:   . Attends Banker Meetings:   Marland Kitchen Marital Status:     Current Medications:  Current Outpatient Medications:  .  cetirizine (ZYRTEC) 10 MG tablet, Take 10 mg by mouth daily., Disp: , Rfl:  .  Desvenlafaxine ER (PRISTIQ) 50 MG TB24, Take 1 tablet (50 mg total) by mouth daily. (Patient not taking: Reported on 12/26/2019), Disp: 30 tablet, Rfl: 2 .  Desvenlafaxine Succinate ER 25 MG TB24, Take 25 mg by mouth daily. (Patient not taking: Reported on  11/11/2019), Disp: 30 tablet, Rfl: 1 .  hyoscyamine (LEVSIN) 0.125 MG tablet, Take 1 tablet (0.125 mg total) by mouth every 4 (four) hours as needed. (Patient not taking: Reported on 12/26/2019), Disp: 30 tablet, Rfl: 0 .  L-Methylfolate 15 MG TABS, Take 1 tablet (15 mg total) by mouth daily. (Patient not taking: Reported on 12/26/2019), Disp: 30 tablet, Rfl: 2 .  latanoprost (XALATAN) 0.005 % ophthalmic solution, Place 1 drop into both eyes at bedtime. (Patient not taking: Reported on 12/26/2019), Disp: , Rfl:  .  ondansetron (ZOFRAN ODT) 4 MG disintegrating tablet, Take 1 tablet (4 mg total) by mouth every 8 (eight) hours as needed for nausea. (Patient not taking: Reported on 12/26/2019), Disp: 10 tablet, Rfl: 0 .  ondansetron (ZOFRAN) 4 MG tablet, Take 1 tablet (4 mg total) by mouth every 8 (eight) hours as needed for nausea or vomiting. (Patient not taking: Reported on 12/26/2019), Disp: 20 tablet, Rfl: 0 .  oxyCODONE-acetaminophen (PERCOCET) 5-325 MG tablet, Take 1 tablet by mouth every 4 (four) hours as needed. (Patient not taking: Reported on 12/26/2019), Disp: 20 tablet, Rfl: 0 .  tamsulosin (FLOMAX) 0.4 MG CAPS capsule, Take 1 capsule (0.4 mg total) by mouth daily after supper. (Patient not taking: Reported on 12/26/2019), Disp: 10 capsule, Rfl: 0  Review of Systems: Pertinent positives as per HPI. Denies appetite changes, fevers, chills, fatigue, unexplained weight changes. Denies hearing loss, neck lumps or masses, mouth sores, ringing in ears or voice changes. Denies cough or wheezing.  Denies shortness of breath. Denies chest pain or palpitations. Denies leg swelling. Denies abdominal distention, pain, blood in stools, diarrhea, nausea, vomiting, or early satiety. Denies pain with intercourse, dysuria, frequency, hematuria or incontinence. Denies hot flashes, pelvic pain, vaginal bleeding or vaginal discharge.   Denies joint pain, back pain or muscle pain/cramps. Denies itching, rash, or  wounds. Denies dizziness, headaches, numbness or seizures. Denies swollen lymph nodes or glands, denies easy bruising or bleeding. Denies anxiety, depression, confusion, or decreased concentration.  Physical Exam: BP (!) 114/96 (BP Location: Left Arm, Patient Position: Sitting)   Pulse 95   Temp 99.4 F (37.4 C) (Oral)   Resp 18   Ht 5\' 7"  (1.702 m)   Wt 240 lb 8 oz (109.1 kg)   SpO2 100%   BMI 37.67 kg/m  General: Alert, oriented, no acute distress. HEENT: Normocephalic, atraumatic, sclera anicteric. Chest: Unlabored breathing on room air. Abdomen: Obese, soft, nontender.  Normoactive bowel sounds.  No masses or hepatosplenomegaly appreciated.  Well-healing incisions with no evidence of keloid  although several incisions have some evidence of either a small seroma or scar tissue in the subcutaneous tissue. Extremities: Grossly normal range of motion.  Warm, well perfused.  No edema bilaterally. Skin: No rashes or lesions noted.  Laboratory & Radiologic Studies: None new  Assessment & Plan: [name] is a 44 y.o. woman 3 weeks s/p robotic BS and UO for complex adnexal mass with pathology revealing benign ovarian cyst.  Patient is doing well from a postoperative standpoint.  There is no evidence of keloid formation on her exam today.  We discussed some subcutaneous scar tissue and the use of massage now that her skin incisions have healed.  We also discussed continued activity restrictions until she is 6 weeks out from surgery in terms of lifting more than 10-15 pounds.  Otherwise, she is released back to full activity.  She is aware that she should call the clinic with any questions or concerns.  Otherwise, she can follow-up with her primary care provider or routine gynecologist for further GYN needs.  20 minutes of total time was spent for this patient encounter, including preparation, face-to-face counseling with the patient and coordination of care, and documentation of  the encounter.  Eugene Garnet, MD  Division of Gynecologic Oncology  Department of Obstetrics and Gynecology  [facility]

## 2019-12-27 ENCOUNTER — Encounter: Payer: Self-pay | Admitting: Physician Assistant

## 2019-12-27 ENCOUNTER — Other Ambulatory Visit: Payer: Self-pay | Admitting: Physician Assistant

## 2019-12-27 NOTE — Telephone Encounter (Signed)
She would need at least a video visit for me to consider writing this letter as I do not feel is medically necessary especially if symptoms are mild and not requiring counseling or medical treatment.

## 2019-12-30 ENCOUNTER — Telehealth: Payer: Self-pay | Admitting: *Deleted

## 2019-12-30 ENCOUNTER — Telehealth (INDEPENDENT_AMBULATORY_CARE_PROVIDER_SITE_OTHER): Payer: BC Managed Care – PPO | Admitting: Physician Assistant

## 2019-12-30 ENCOUNTER — Encounter: Payer: Self-pay | Admitting: Physician Assistant

## 2019-12-30 DIAGNOSIS — F419 Anxiety disorder, unspecified: Secondary | ICD-10-CM

## 2019-12-30 DIAGNOSIS — F329 Major depressive disorder, single episode, unspecified: Secondary | ICD-10-CM | POA: Diagnosis not present

## 2019-12-30 NOTE — Progress Notes (Signed)
   Virtual Visit via Video   I connected with patient on 01/02/20 at  2:00 PM EDT by a video enabled telemedicine application and verified that I am speaking with the correct person using two identifiers.  Location patient: Home Location provider: [place] [facility], Office Persons participating in the virtual visit: Patient, Provider, CMA (Patina Moore)  I discussed the limitations of evaluation and management by telemedicine and the availability of in person appointments. The patient expressed understanding and agreed to proceed.  Subjective:   HPI:   Patient presents via Caregility today to discuss potential work accommodations due to her anxiety and depression. Patient currently on a regimen of L-methylfolate per [facility] test results. Notes she has been staying stable overall due to being able to work from home, keeping her out of a hostile work environment that has played a big part on stress, anxiety and mood previously. 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   ROS:   See pertinent positives and negatives per HPI.  Patient Active Problem List   Diagnosis Date Noted  . Cyst of ovary 11/15/2019  . Acne vulgaris 01/07/2018  . Depression 01/07/2018  . Cholelithiasis 05/29/2014  . Visit for preventive health examination 04/14/2014  . Breast cancer screening 04/14/2014    Social History   Tobacco Use  . Smoking status: Never Smoker  . Smokeless tobacco: Never Used  Substance Use Topics  . Alcohol use: No    Current Outpatient Medications:  .  L-Methylfolate 15 MG TABS, Take 1 tablet (15 mg total) by mouth daily. (Patient not taking: Reported on 12/26/2019), Disp: 30 tablet, Rfl: 2  No Known Allergies  Objective:   There were no vitals taken for this visit.  Patient is well-developed, well-nourished in no acute distress.  Resting  comfortably at home.  Head is normocephalic, atraumatic.  No labored breathing.  Speech is clear and coherent with logical content.  Patient is alert and oriented at baseline.   Assessment and Plan:   1. Anxiety and depression Has been doing much better overall, combination of L-methylfolate supplementation and a calmer work environment. Will continue medical food. Will grant work accommodation of allowing patient to work from home 2-3 days per work week. Will follow-up 3 months for reassessment of things. Discussed with her this will not be an ongoing thing -- we will need to consider addition of other agents and restarting CBT or she will need to consider looking for other jobs.    Piedad Climes, PA-C 01/02/2020

## 2019-12-30 NOTE — Telephone Encounter (Signed)
Called the patient and explained that we have faxed her FMLA

## 2020-01-04 ENCOUNTER — Other Ambulatory Visit: Payer: Self-pay

## 2020-01-04 ENCOUNTER — Telehealth (INDEPENDENT_AMBULATORY_CARE_PROVIDER_SITE_OTHER): Payer: BC Managed Care – PPO | Admitting: Physician Assistant

## 2020-01-04 ENCOUNTER — Encounter: Payer: Self-pay | Admitting: Physician Assistant

## 2020-01-04 DIAGNOSIS — F329 Major depressive disorder, single episode, unspecified: Secondary | ICD-10-CM

## 2020-01-04 DIAGNOSIS — F419 Anxiety disorder, unspecified: Secondary | ICD-10-CM

## 2020-01-04 NOTE — Progress Notes (Signed)
I have discussed the procedure for the virtual visit with the patient who has given consent to proceed with assessment and treatment.   Wrenn Willcox S Jalon Squier, CMA     

## 2020-01-04 NOTE — Progress Notes (Signed)
   Virtual Visit via Video   I connected with patient on 01/04/20 at  2:00 PM EDT by a video enabled telemedicine application and verified that I am speaking with the correct person using two identifiers.  Location patient: Home Location provider: [place] [facility], Office Persons participating in the virtual visit: Patient, Provider, CMA (Patina Moore)  I discussed the limitations of evaluation and management by telemedicine and the availability of in person appointments. The patient expressed understanding and agreed to proceed.  Subjective:   HPI:   Patient presents via Caregility today to discuss further steps for her anxiety and potential work accommodations. 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 It was recommended to consider restarting counseling along with her L-methylfolate and to consider restarting SSRI/SNRI. She declined at that time. Now would like to see a counselor and get a referral to psychiatry.   ROS:   See pertinent positives and negatives per HPI.  Patient Active Problem List   Diagnosis Date Noted  . Cyst of ovary 11/15/2019  . Acne vulgaris 01/07/2018  . Depression 01/07/2018  . Cholelithiasis 05/29/2014  . Visit for preventive health examination 04/14/2014  . Breast cancer screening 04/14/2014    Social History   Tobacco Use  . Smoking status: Never Smoker  . Smokeless tobacco: Never Used  Substance Use Topics  . Alcohol use: No    Current Outpatient Medications:  .  L-Methylfolate 15 MG TABS, Take 1 tablet (15 mg total) by mouth daily. (Patient not taking: Reported on 12/26/2019), Disp: 30 tablet, Rfl: 2  No Known Allergies  Objective:   There were no vitals taken for this visit.  Patient is well-developed, well-nourished in no acute distress.  Resting comfortably at home.  Head is normocephalic, atraumatic.  No  labored breathing.  Speech is clear and coherent with logical content.  Patient is alert and oriented at baseline.   Assessment and Plan:   1. Anxiety and depression Referral to psychiatry placed.  Also gave patient information for counseling services.  Continue l-methylfolate.  Discussed with her that we can do intermittent FMLA if needed but if continual FMLA as needed for this she will need this to come from a psychiatric provider.    Piedad Climes, PA-C 01/04/2020

## 2020-01-04 NOTE — Patient Instructions (Signed)
Instructions sent to MyChart

## 2020-01-06 ENCOUNTER — Ambulatory Visit: Payer: BC Managed Care – PPO | Admitting: Physician Assistant

## 2020-01-26 ENCOUNTER — Encounter: Payer: Self-pay | Admitting: Physician Assistant

## 2020-02-21 ENCOUNTER — Encounter: Payer: Self-pay | Admitting: Physician Assistant

## 2020-04-19 ENCOUNTER — Other Ambulatory Visit: Payer: Self-pay

## 2020-04-19 ENCOUNTER — Encounter: Payer: Self-pay | Admitting: Physician Assistant

## 2020-04-19 ENCOUNTER — Telehealth (INDEPENDENT_AMBULATORY_CARE_PROVIDER_SITE_OTHER): Payer: BC Managed Care – PPO | Admitting: Physician Assistant

## 2020-04-19 DIAGNOSIS — M25512 Pain in left shoulder: Secondary | ICD-10-CM

## 2020-04-19 DIAGNOSIS — B353 Tinea pedis: Secondary | ICD-10-CM | POA: Diagnosis not present

## 2020-04-19 DIAGNOSIS — J310 Chronic rhinitis: Secondary | ICD-10-CM | POA: Diagnosis not present

## 2020-04-19 DIAGNOSIS — G8929 Other chronic pain: Secondary | ICD-10-CM | POA: Diagnosis not present

## 2020-04-19 MED ORDER — LEVOCETIRIZINE DIHYDROCHLORIDE 5 MG PO TABS: 5.0000 mg | ORAL_TABLET | Freq: Every evening | ORAL | 1 refills | Status: DC

## 2020-04-19 MED ORDER — FLUTICASONE PROPIONATE 50 MCG/ACT NA SUSP: 2.0000 | Freq: Every day | NASAL | 6 refills | Status: AC

## 2020-04-19 MED ORDER — MELOXICAM 15 MG PO TABS: 15.0000 mg | ORAL_TABLET | Freq: Every day | ORAL | 0 refills | Status: DC

## 2020-04-19 MED ORDER — TERBINAFINE HCL 250 MG PO TABS: 250.0000 mg | ORAL_TABLET | Freq: Every day | ORAL | 0 refills | Status: AC

## 2020-04-19 NOTE — Progress Notes (Signed)
I have discussed the procedure for the virtual visit with the patient who has given consent to proceed with assessment and treatment.   Cheo Selvey S Donalyn Schneeberger, CMA     

## 2020-04-19 NOTE — Patient Instructions (Signed)
Instructions sent to patients MyChart.

## 2020-04-19 NOTE — Progress Notes (Signed)
Virtual Visit via Video   I connected with patient on 04/19/20 at 11:00 AM EDT by a video enabled telemedicine application and verified that I am speaking with the correct person using two identifiers.  Location patient: Home Location provider: [place] [facility], Office Persons participating in the virtual visit: Patient, Provider, CMA (Patina Moore)  I discussed the limitations of evaluation and management by telemedicine and the availability of in person appointments. The patient expressed understanding and agreed to proceed.  Subjective:   HPI:   Patient presents via Caregility today to discuss multiple concerns.   Patient endorses increase in symptoms of her allergic rhinitis over the past couple of months. Notes increase in severity of sinus congestion, headache, rhinorrhea, sneezing. Denies any fevers, chills. Denies tooth pain or ear pain. Notes mild ocular allergy symptoms -- watering, itching, burning. Denies chest tightness or SOB. Has been taking daily Zyrtec on a chronic basis.  Notes this no longer seems to be helping.  Patient also endorses 4 months of left shoulder pain described as a aching sensation associated with popping and cracking when she moves her arm.  Denies decreased range of motion of shoulder but notes pain with range of motion.  Denies any noted trauma or injury.  Denies bruising, redness or swelling of the shoulder.  Denies any radiation of pain down to the distal upper extremity.  Denies any numbness or tingling.  Has not taken anything for symptoms..   Patient also previously seen for mild tinea pedis.  Was given prescription for Lotrisone.  Notes using as directed with some improvement but symptoms have recurred.  ROS:   See pertinent positives and negatives per HPI.  Patient Active Problem List   Diagnosis Date Noted   Cyst of ovary 11/15/2019   Acne vulgaris 01/07/2018   Anxiety and depression 01/07/2018   Cholelithiasis 05/29/2014    Visit for preventive health examination 04/14/2014   Breast cancer screening 04/14/2014    Social History   Tobacco Use   Smoking status: Never Smoker   Smokeless tobacco: Never Used  Substance Use Topics   Alcohol use: No   No current outpatient medications on file.  No Known Allergies  Objective:   There were no vitals taken for this visit.  Patient is well-developed, well-nourished in no acute distress.  Resting comfortably at home.  Head is normocephalic, atraumatic.  No labored breathing.  Speech is clear and coherent with logical content.  Patient is alert and oriented at baseline.   Assessment and Plan:   1. Chronic rhinitis We will have her stop Zyrtec.  Rx Xyzal 5 mg once daily.  Rx Flonase nasal spray to start daily.  Recommend air purifier.  Start saline nasal rinse.  Follow-up if symptoms or not substantially improving. - levocetirizine (XYZAL) 5 MG tablet; Take 1 tablet (5 mg total) by mouth every evening.  Dispense: 90 tablet; Refill: 1 - fluticasone (FLONASE) 50 MCG/ACT nasal spray; Place 2 sprays into both nostrils daily.  Dispense: 16 g; Refill: 6  2. Chronic left shoulder pain Seems related to osteoarthritis given this is her dominant hand and associated crepitus.  Needs further assessment.  We will proceed with plain films of left shoulder.  Rx meloxicam 15 mg once daily with food.  Tylenol for breakthrough pain.  Supportive measures reviewed with patient. - DG Shoulder Left; Future - meloxicam (MOBIC) 15 MG tablet; Take 1 tablet (15 mg total) by mouth daily.  Dispense: 30 tablet; Refill: 0  3. Tinea pedis  of both feet Resistant to topicals.  Rx terbinafine 250 mg once daily x14 days.  If no resolution we will plan for her to follow-up with podiatry or dermatology. - terbinafine (LAMISIL) 250 MG tablet; Take 1 tablet (250 mg total) by mouth daily.  Dispense: 14 tablet; Refill: 0    Piedad Climes, [place] 04/19/2020

## 2020-05-28 ENCOUNTER — Encounter: Payer: Self-pay | Admitting: Gynecologic Oncology

## 2020-07-05 ENCOUNTER — Other Ambulatory Visit: Payer: Self-pay

## 2020-07-05 DIAGNOSIS — Z20822 Contact with and (suspected) exposure to covid-19: Secondary | ICD-10-CM

## 2020-07-10 ENCOUNTER — Telehealth: Payer: Self-pay | Admitting: Physician Assistant

## 2020-07-10 NOTE — Telephone Encounter (Signed)
Patient had at [facility]. Result not back yet. They should call her once results are in.

## 2020-07-10 NOTE — Telephone Encounter (Signed)
Patient wants to know if her lab results for covid were positive or not - She needs to know if she can go back to work and if she should stay away from people.  Please advise.

## 2020-07-11 LAB — NOVEL CORONAVIRUS, NAA: SARS-CoV-2, NAA: DETECTED — AB

## 2020-07-13 ENCOUNTER — Other Ambulatory Visit: Payer: Self-pay | Admitting: Physician Assistant

## 2020-07-13 DIAGNOSIS — G8929 Other chronic pain: Secondary | ICD-10-CM

## 2020-09-11 ENCOUNTER — Other Ambulatory Visit: Payer: Self-pay

## 2020-09-11 DIAGNOSIS — J310 Chronic rhinitis: Secondary | ICD-10-CM

## 2020-09-11 DIAGNOSIS — M25512 Pain in left shoulder: Secondary | ICD-10-CM

## 2020-09-11 DIAGNOSIS — G8929 Other chronic pain: Secondary | ICD-10-CM

## 2020-09-11 MED ORDER — LEVOCETIRIZINE DIHYDROCHLORIDE 5 MG PO TABS: 5.0000 mg | ORAL_TABLET | Freq: Every evening | ORAL | 0 refills | Status: AC

## 2020-09-11 MED ORDER — MELOXICAM 15 MG PO TABS: ORAL_TABLET | ORAL | 0 refills | Status: DC

## 2020-12-10 ENCOUNTER — Other Ambulatory Visit: Payer: Self-pay

## 2020-12-10 DIAGNOSIS — J310 Chronic rhinitis: Secondary | ICD-10-CM

## 2020-12-10 DIAGNOSIS — M25512 Pain in left shoulder: Secondary | ICD-10-CM

## 2020-12-10 MED ORDER — MELOXICAM 15 MG PO TABS: ORAL_TABLET | ORAL | 0 refills | Status: AC

## 2021-07-08 ENCOUNTER — Telehealth: Payer: Self-pay | Admitting: Physician Assistant

## 2021-07-08 NOTE — Telephone Encounter (Signed)
I have LM asking pt to call back to schedule a TOC with another provider or have her new pcp take over the medication.

## 2021-07-24 ENCOUNTER — Ambulatory Visit (INDEPENDENT_AMBULATORY_CARE_PROVIDER_SITE_OTHER): Payer: No Payment, Other | Admitting: Licensed Clinical Social Worker

## 2021-07-24 DIAGNOSIS — F331 Major depressive disorder, recurrent, moderate: Secondary | ICD-10-CM | POA: Diagnosis not present
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
## 2021-08-22 ENCOUNTER — Ambulatory Visit (INDEPENDENT_AMBULATORY_CARE_PROVIDER_SITE_OTHER): Payer: No Payment, Other | Admitting: Licensed Clinical Social Worker

## 2021-08-22 DIAGNOSIS — F331 Major depressive disorder, recurrent, moderate: Secondary | ICD-10-CM

## 2021-08-27 ENCOUNTER — Ambulatory Visit (HOSPITAL_COMMUNITY): Payer: No Payment, Other | Admitting: Psychiatry

## 2021-08-27 ENCOUNTER — Encounter (HOSPITAL_COMMUNITY): Payer: Self-pay

## 2021-09-30 ENCOUNTER — Telehealth: Payer: Self-pay

## 2021-09-30 NOTE — Telephone Encounter (Signed)
Patient is traveling out of the country and is needing her immunization records. Wanting to know if she can get a copy or know if she is vaccinated against Hep A/B ?

## 2021-09-30 NOTE — Telephone Encounter (Signed)
Patient was notified, she still would like a copy of what we have on file if possible.  ?

## 2021-10-01 NOTE — Telephone Encounter (Signed)
Patient notified

## 2021-10-07 ENCOUNTER — Ambulatory Visit (INDEPENDENT_AMBULATORY_CARE_PROVIDER_SITE_OTHER): Payer: No Payment, Other | Admitting: Licensed Clinical Social Worker

## 2021-10-07 ENCOUNTER — Encounter (HOSPITAL_COMMUNITY): Payer: Self-pay

## 2021-10-07 DIAGNOSIS — F4320 Adjustment disorder, unspecified: Secondary | ICD-10-CM | POA: Diagnosis not present

## 2021-10-07 DIAGNOSIS — Z560 Unemployment, unspecified: Secondary | ICD-10-CM | POA: Insufficient documentation

## 2021-10-07 DIAGNOSIS — F4321 Adjustment disorder with depressed mood: Secondary | ICD-10-CM
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
## 2021-10-07 NOTE — BH OP Treatment Plan (Signed)
Pt agrees to tx plan °

## 2021-10-07 NOTE — Plan of Care (Signed)
?  Problem: Depression CCP Problem  1 Learn and Apply Coping Skills to Decrease Depressive Symptoms   ?Goal:  [name] will have increased energy and motivation as evidenced by self-report. ?Outcome: Not Applicable ?Goal: STG: [name] WILL PARTICIPATE IN AT LEAST 80% OF SCHEDULED INDIVIDUAL PSYCHOTHERAPY SESSIONS ?Outcome: Not Applicable ?Goal: STG: [name] WILL COMPLETE AT LEAST 80% OF ASSIGNED HOMEWORK ?Outcome: Not Applicable ?  ?

## 2021-10-28 ENCOUNTER — Ambulatory Visit (HOSPITAL_COMMUNITY): Payer: No Payment, Other | Admitting: Licensed Clinical Social Worker

## 2021-10-30 LAB — HM PAP SMEAR

## 2021-11-22 ENCOUNTER — Ambulatory Visit (INDEPENDENT_AMBULATORY_CARE_PROVIDER_SITE_OTHER): Payer: No Payment, Other | Admitting: Physician Assistant

## 2021-11-22 DIAGNOSIS — F4321 Adjustment disorder with depressed mood: Secondary | ICD-10-CM | POA: Diagnosis not present

## 2021-11-22 DIAGNOSIS — Z560 Unemployment, unspecified: Secondary | ICD-10-CM | POA: Diagnosis not present

## 2021-11-22 MED ORDER — BUPROPION HCL ER (XL) 150 MG PO TB24: 150.0000 mg | ORAL_TABLET | ORAL | 1 refills | Status: AC
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
## 2021-11-25 ENCOUNTER — Encounter (HOSPITAL_COMMUNITY): Payer: Self-pay | Admitting: Physician Assistant

## 2021-11-27 ENCOUNTER — Ambulatory Visit (INDEPENDENT_AMBULATORY_CARE_PROVIDER_SITE_OTHER): Payer: No Payment, Other | Admitting: Licensed Clinical Social Worker

## 2021-11-27 DIAGNOSIS — F4321 Adjustment disorder with depressed mood: Secondary | ICD-10-CM

## 2021-11-27 NOTE — Progress Notes (Signed)
Virtual Visit via Video Note  I connected with [name] on 11/27/21 at 10:00 AM EDT by a video enabled telemedicine application and verified that I am speaking with the correct person using two identifiers.  Location: Patient: pt's home Provider: clinical office   I discussed the limitations of evaluation and management by telemedicine and the availability of in person appointments. The patient expressed understanding and agreed to proceed.   I discussed the assessment and treatment plan with the patient. The patient was provided an opportunity to ask questions and all were answered. The patient agreed with the plan and demonstrated an understanding of the instructions.   The patient was advised to call back or seek an in-person evaluation if the symptoms worsen or if the condition fails to improve as anticipated.  I provided 56 minutes of non-face-to-face time during this encounter.   Heron Nay, LCSWA    THERAPIST PROGRESS NOTE  Session Time: 56 minutes  Participation Level: Active  Behavioral Response: CasualAlertBright  Type of Therapy: Individual Therapy  Treatment Goals addressed: depression, communication  ProgressTowards Goals: Progressing  Interventions: CBT  Summary: [name] is a 46 y.o. female who presents for f/u with this cln. She connects on time and maintains good eye contact throughout. Her pattern of speech is noted to be excessively impressionistic and lacking in detail and she demonstrates superficial display of emotion throughout. She is noted to be smiling throughout and laughs often during the session. When asked about current stressors and symptoms, she responds, "Everything is good, everything is fine." Denies current symptoms. She reports she recently met with psychiatric provider and states the appointment went fine. 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 "Everything seems to always work out. I  just need to remind myself of that. I am running out of money. If I can get one or two clients a month, I'm fine. I'm working on my business. I'm working on my energy, my internal energy. My energy flow of what I project to people, what I give to people. There's a concept I like to lean on called energy management. I'm working on managing that energy toward myself rather than giving to others." Confirms that she is working to set boundaries in romantic relationships. States she just got back from a trip to [place] last Tuesday and states the trip provided perspective and was affirming. "In my mind, I'm married to a billionaire." She said she met a man in [place] that said he would meet her in the [place] on his jet and that he has been contacting her. She states she is certain he is wealthy based on the status he appeared to have in [place] and based on his demeanor. She also states that to be pursued by man, he has to chase her. She reports that her intention in sharing this is to demonstrate to cln that she can manifest things with her thoughts. When asked about her difficulty in finding a job, she reiterates that she is overqualified and/or the jobs are only posted because they have to be, and other candidates have already been selected. This was discussed in the previous session with this cln. Pt previously stated she needs to get a "job job" but during this session states "The 'job job' is not on my vision board." She denies any problems with her interviewing skills and states she has been told by others that her resume and interviewing skills do not require any changes. 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 She goes on to say that she "really haven't had to interview before." She verbalizes resistance to feedback regarding this and cln asks about other stressors previously mentioned, specifically  in her dating life. She describes relationship issues with a current partner, who she expresses frustration with as he will not commit. 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 Pt also reports she informed him she is dating other men, and when asked about this she states she is not actually dating other men, but open to it. She is receptive to feedback from cln regarding her impressionistic speech and notes that she has been told this before. She is also receptive to feedback regarding her appearance of not committing to the relationship she is discussing. She becomes tearful when talking about this and verbalizes agreement that she has not directly communicated her intentions and wants from the relationship. She states it is easy for her to be vulnerable, but also states she realizes she is indirect when talking about her feelings  Suicidal/Homicidal: Nowithout intent/plan  Therapist Response: Cln assessed for current stressors, symptoms, and safety since last session. Cln utilized active listening and validation to assist with processing. Cln had difficulty following pt's train of thought, as pt's speech pattern is vague and impressionistic. Cln phrased this as, "You're using a lot of words, but you're not actually answering my questions." Cln also conducted reality testing regarding pt's report of the wealthy man in [place] as well as pt's reported beliefs surrounding why she has not had successful interviews. Cln pointed out that pt may not be interviewing as well as she believes due to numerous rejections from all over the country and recommends connecting with a professional to hone her interviewing skills. Cln reiterated pt's style of communication could be a barrier that she is not aware of. Cln also discussed how individuals have different communication styles and thus  direct communication is vital, and also discussed the importance of vulnerability in relationships as well as in therapy. Pt is overall receptive and appreciative. Cln scheduled f/u appointments and confirmed pt's availability and preferred method of service delivery.  Plan: Return again in 8 weeks (next available appt).  Diagnosis: Adjustment disorder with depressed mood  Collaboration of Care: Other none required  Patient/Guardian was advised Release of Information must be obtained prior to any record release in order to collaborate their care with an outside provider. Patient/Guardian was advised if they have not already done so to contact the registration department to sign all necessary forms in order for [place] to release information regarding their care.   Consent: Patient/Guardian gives verbal consent for treatment and assignment of benefits for services provided during this visit. Patient/Guardian expressed understanding and agreed to proceed.   Heron Nay, LCSWA 11/27/2021

## 2021-12-23 ENCOUNTER — Other Ambulatory Visit: Payer: Self-pay | Admitting: Obstetrics and Gynecology

## 2021-12-23 DIAGNOSIS — Z1231 Encounter for screening mammogram for malignant neoplasm of breast: Secondary | ICD-10-CM

## 2021-12-30 ENCOUNTER — Ambulatory Visit: Payer: Self-pay | Admitting: *Deleted

## 2021-12-30 VITALS — BP 130/90 | Wt 232.6 lb

## 2021-12-30 DIAGNOSIS — Z1211 Encounter for screening for malignant neoplasm of colon: Secondary | ICD-10-CM

## 2021-12-30 DIAGNOSIS — Z1239 Encounter for other screening for malignant neoplasm of breast: Secondary | ICD-10-CM

## 2021-12-30 DIAGNOSIS — R87612 Low grade squamous intraepithelial lesion on cytologic smear of cervix (LGSIL): Secondary | ICD-10-CM

## 2021-12-30 NOTE — Progress Notes (Signed)
[name] is a 46 y.o. female who presents to [facility] clinic today with no complaints. Patient referred to [facility] by [facility] due to having an abnormal Pap smear 10/24/2021 that a colposcopy is recommended for follow up.   Pap Smear: Pap smear not completed today. Last Pap smear was 10/24/2021 at [facility] clinic and was abnormal - LSIL with positive HPV . 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 Last Pap smear result is available in Epic under care everywhere.   Physical exam: Breasts Breasts symmetrical. No skin abnormalities bilateral breasts. No nipple retraction bilateral breasts. No nipple discharge bilateral breasts. No lymphadenopathy. No lumps palpated bilateral breasts. No complaints of pain or tenderness on exam.  MM DIAG BREAST TOMO UNI RIGHT  Result Date: 04/08/2019 CLINICAL DATA:  Screening recall for a right breast asymmetry. EXAM: DIGITAL DIAGNOSTIC UNILATERAL RIGHT MAMMOGRAM WITH CAD AND TOMO COMPARISON:  Previous exam(s). ACR Breast Density Category b: There are scattered areas of fibroglandular density. FINDINGS: The asymmetry in the central right breast seen on the CC view resolves with spot compression tomosynthesis imaging, and is consistent with overlapping fibroglandular tissue. Mammographic images were processed with CAD. IMPRESSION: Resolution of the right breast asymmetry consistent with overlapping fibroglandular tissue. RECOMMENDATION: Screening mammogram in one year.(Code:[id number]) I have discussed the findings and recommendations with the patient. If applicable, a reminder letter will be sent to the patient regarding the next appointment. BI-RADS CATEGORY  1: Negative. Electronically Signed   By: Frederico Hamman M.D.   On: 04/08/2019 10:08        Pelvic/Bimanual Pap is not indicated today per BCCCP guidelines.   Smoking History: Patient has  never smoked.   Patient Navigation: Patient education provided. Access to services provided for patient through BCCCP program.   Colorectal Cancer Screening: Per patient has never had colonoscopy completed. FIT Test given to patient to complete. No complaints today.    Breast and Cervical Cancer Risk Assessment: Patient has family history of her maternal grandmother having breast cancer. Patient has no known genetic mutations or history of radiation treatment to the chest before age 60. Patient has history of cervical dysplasia. Patient has no history of being immunocompromised or DES exposure in-utero.  Risk Assessment     Risk Scores       12/30/2021   Last edited by: Meryl Dare, CMA   5-year risk: 0.8 %   Lifetime risk: 7 %           A: BCCCP exam without pap smear No complaints.  P: Referred patient to the [facility] for a screening mammogram on mobile unit. Appointment scheduled Thursday, January 02, 2022 at 0900.  Referred patient to the [facility] for [facility] at [facility] for a colposcopy to follow up for her abnormal Pap smear. Appointment scheduled Monday, January 27, 2022 at 1310.  Priscille Heidelberg, RN 12/30/2021 1:35 PM

## 2021-12-30 NOTE — Patient Instructions (Signed)
Explained breast self awareness with [name]. Patient did not need a Pap smear today due to last Pap smear was 10/24/2021. Explained the colposcopy the recommended follow up for her abnormal Pap smear. Referred patient to the [facility] for [facility] at [facility] for a colposcopy to follow up for her abnormal Pap smear. Appointment scheduled Monday, January 27, 2022 at 1310. Referred patient to the [facility] of [facility] for a screening mammogram on mobile unit. Appointment scheduled Thursday, January 02, 2022 at 0900. Patient aware of appointment and will be there. Let patient know the Breast Center will follow up with her within the next couple weeks with results of her mammogram by letter or phone. [name] verbalized understanding.  Leontyne Manville, Kathaleen Maser, RN 1:35 PM

## 2022-01-02 ENCOUNTER — Ambulatory Visit
Admission: RE | Admit: 2022-01-02 | Discharge: 2022-01-02 | Disposition: A | Discharge status: Home / Self Care | Payer: No Typology Code available for payment source | Source: Ambulatory Visit | Attending: Obstetrics and Gynecology | Admitting: Obstetrics and Gynecology

## 2022-01-02 DIAGNOSIS — Z1231 Encounter for screening mammogram for malignant neoplasm of breast: Secondary | ICD-10-CM

## 2022-01-20 ENCOUNTER — Ambulatory Visit (HOSPITAL_COMMUNITY): Payer: No Payment, Other | Admitting: Licensed Clinical Social Worker

## 2022-01-20 ENCOUNTER — Encounter (HOSPITAL_COMMUNITY): Payer: Self-pay

## 2022-01-23 ENCOUNTER — Telehealth (HOSPITAL_COMMUNITY): Payer: No Payment, Other | Admitting: Physician Assistant

## 2022-01-27 ENCOUNTER — Encounter: Payer: Self-pay | Admitting: Obstetrics & Gynecology

## 2022-01-29 ENCOUNTER — Ambulatory Visit (INDEPENDENT_AMBULATORY_CARE_PROVIDER_SITE_OTHER): Payer: No Payment, Other | Admitting: Licensed Clinical Social Worker

## 2022-01-29 DIAGNOSIS — F4321 Adjustment disorder with depressed mood: Secondary | ICD-10-CM

## 2022-01-29 NOTE — Progress Notes (Signed)
Virtual Visit via Video Note  I connected with [name] on 01/29/22 at 10:00 AM EDT by a video enabled telemedicine application and verified that I am speaking with the correct person using two identifiers.  Location: Patient: pt's car (confirmed she is stationary) Provider: clinical office   I discussed the limitations of evaluation and management by telemedicine and the availability of in person appointments. The patient expressed understanding and agreed to proceed.   I discussed the assessment and treatment plan with the patient. The patient was provided an opportunity to ask questions and all were answered. The patient agreed with the plan and demonstrated an understanding of the instructions.   The patient was advised to call back or seek an in-person evaluation if the symptoms worsen or if the condition fails to improve as anticipated.  I provided 43 minutes of non-face-to-face time during this encounter.   [name], LCSWA    THERAPIST PROGRESS NOTE  Session Time: 43 minutes  Participation Level: Active  Behavioral Response: CasualAlertElevated  Type of Therapy: Individual Therapy  Treatment Goals addressed: coping and communication  ProgressTowards Goals: Progressing  Interventions: CBT and Motivational Interviewing  Summary: [name] is a 46 y.o. female who presents for f/u with this cln. She connects to the session on time and maintains good eye contact throughout. She presents with pressured speech and continues to demonstrate Cluster B traits. States after last session, "a lightbulb went off" in regards to her vague communication and can trace this habit back to the 90's, wherein she had to be vague in order to protect herself and others. She addresses cln's feedback from the previous session, reiterating that cln's feedback was inaccurate and states this is related to pt's vague communication. She reports she is usually guarded because  of her history as well as "a couple of NDAs" and wanting to protect others. 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 She reports she just started a job with the [employer] last Monday and states she thinks she's going to like it. She also discusses issues with the man she previously discussed, with whom she works in her business, and states he was disrespectful toward her last weekend and states they are not currently speaking. She describes seeing some of these traits in another relationship with a friend. She then discusses her "philosophical and spiritual beliefs" and shares that she believes ideas aren't just "there" but that someone or something is putting them there. She refers back to her envisioning a billionaire husband and states it is possible and cites [employer] as evidence that it is not as unlikely as people believe. She states it is important for cln to know that she is not crazy or delusional. She states her dream of marrying a billionaire is not about the money or status. She shares that she knows a lot of famous and influential people. When asked about the allure of marrying a billionaire, she responds "there is no allure." She shares that she believes the universe placed into her the desire to marry a billionaire and that it will manifest in some way, such as writing a book centered around that or by marrying a billionaire. "The why, I'm still trying to work through it." She states it's worth exploring why this is her desire. She states she believes love grows and love is a choice. "You'll never catch me  slipping and falling." She discusses the man she met in [place], who lives in [place], and states, "He set his intentions on me. He looked at me in that way, like he saw me and it made me blush." She reports he has been talking to this man daily and states he has asked to talk to her via  Facetime and she is hesitant to do so because he lives outside of the country. "Until he gets on a plane and comes here. He needs to show me that he wants me. He needs to pursue me." She notes he has been more direct with him than others in a relationship. She states her anxiety and depression have improved but she still experiences some decreased energy and feeling overwhelmed, and reports she needs a med man appointment. She denies "highs and lows" indicative of bipolar disorder. Cln confirmed appointment scheduled on 8/30. She also shares that she and [name]'s brother are going to be working on something. She states she will think about why she feels compelled to marry a billionaire and wants to discuss this in the next session.  Suicidal/Homicidal: Nowithout intent/plan  Therapist Response: Cln assessed for current stressors, symptoms, and safety since last session. Cln utilized active listening and validation to assist with processing. Cln clarified impressions of previous session and asked pt if she experiences elevated mood and then periods of depression to rule out bipolar disorder. Cln assured pt that she is not crazy but that she presents in a way that suggest some pathology, but also stated pt's presentation may just be her personality. Pt's presentation is consistent with a Cluster B personality disorder or Delusional Disorder, but cln does not have adequate information to assign either diagnosis at this time. Cln scheduled follow-up appointment and confirmed pt's availability and preferred method of service delivery (virtual).   Plan: Return again in 2 weeks.  Diagnosis: Adjustment disorder with depressed mood  Collaboration of Care: Other none required for this visit  Patient/Guardian was advised Release of Information must be obtained prior to any record release in order to collaborate their care with an outside provider. Patient/Guardian was advised if they have not already done so to  contact the registration department to sign all necessary forms in order for [place] to release information regarding their care.   Consent: Patient/Guardian gives verbal consent for treatment and assignment of benefits for services provided during this visit. Patient/Guardian expressed understanding and agreed to proceed.   [name], LCSWA 01/29/2022

## 2022-02-12 ENCOUNTER — Encounter (HOSPITAL_COMMUNITY): Payer: Self-pay

## 2022-02-12 ENCOUNTER — Ambulatory Visit (HOSPITAL_COMMUNITY): Payer: No Payment, Other | Admitting: Licensed Clinical Social Worker

## 2022-02-12 NOTE — Progress Notes (Signed)
Cln signed on at 10:20 am due to connectivity issues and sent text link. Pt signed on and reported she is in [facility] moving her daughter into her dorm and unable to talk. Stated she remembered the session on Monday but forgot today until the link was sent. She also stated she is not able to engage in therapy at this time. Confirmed next scheduled appt (10/9) and moved pt up on cancellation list.

## 2022-02-26 ENCOUNTER — Encounter (HOSPITAL_COMMUNITY): Payer: Self-pay

## 2022-02-26 ENCOUNTER — Telehealth (HOSPITAL_COMMUNITY): Payer: No Payment, Other | Admitting: Student

## 2022-03-19 ENCOUNTER — Encounter: Payer: Self-pay | Admitting: Student

## 2022-03-19 ENCOUNTER — Encounter: Payer: Self-pay | Admitting: Obstetrics and Gynecology

## 2022-04-02 ENCOUNTER — Other Ambulatory Visit: Payer: Self-pay

## 2022-04-03 ENCOUNTER — Encounter: Payer: Self-pay | Admitting: Obstetrics and Gynecology

## 2022-04-07 ENCOUNTER — Other Ambulatory Visit (HOSPITAL_COMMUNITY)
Admission: RE | Admit: 2022-04-07 | Discharge: 2022-04-07 | Disposition: A | Discharge status: Home / Self Care | Payer: Self-pay | Source: Ambulatory Visit | Attending: Obstetrics & Gynecology | Admitting: Obstetrics & Gynecology

## 2022-04-07 ENCOUNTER — Ambulatory Visit (INDEPENDENT_AMBULATORY_CARE_PROVIDER_SITE_OTHER): Payer: Self-pay | Admitting: Obstetrics and Gynecology

## 2022-04-07 ENCOUNTER — Encounter: Payer: Self-pay | Admitting: Obstetrics and Gynecology

## 2022-04-07 ENCOUNTER — Telehealth (INDEPENDENT_AMBULATORY_CARE_PROVIDER_SITE_OTHER): Payer: No Payment, Other | Admitting: Licensed Clinical Social Worker

## 2022-04-07 VITALS — BP 126/84 | HR 79 | Ht 67.0 in | Wt 238.2 lb

## 2022-04-07 DIAGNOSIS — R87612 Low grade squamous intraepithelial lesion on cytologic smear of cervix (LGSIL): Secondary | ICD-10-CM

## 2022-04-07 DIAGNOSIS — Z01812 Encounter for preprocedural laboratory examination: Secondary | ICD-10-CM

## 2022-04-07 DIAGNOSIS — F4321 Adjustment disorder with depressed mood: Secondary | ICD-10-CM | POA: Diagnosis not present

## 2022-04-07 DIAGNOSIS — R87810 Cervical high risk human papillomavirus (HPV) DNA test positive: Secondary | ICD-10-CM

## 2022-04-07 LAB — POCT URINE PREGNANCY: Preg Test, Ur: NEGATIVE

## 2022-04-07 NOTE — Progress Notes (Signed)
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  Placed in lithotomy position. Cervix viewed with speculum and colposcope after application of acetic acid.   Colposcopy adequate?  yes Acetowhite lesions?no Punctation?no Mosaicism?  no Abnormal vasculature?  no Biopsies?no ECC?yes  COMMENTS: Patient was given post procedure instructions.  She will return in 2 weeks for results.  Mora Bellman, MD

## 2022-04-07 NOTE — Plan of Care (Signed)
  Problem: Depression CCP Problem  1 Learn and Apply Coping Skills to Decrease Depressive Symptoms   Intervention: WORK WITH Ginni TO IDENTIFY THE MAJOR COMPONENTS OF A RECENT EPISODE OF DEPRESSION: PHYSICAL SYMPTOMS, MAJOR THOUGHTS AND IMAGES, AND MAJOR BEHAVIORS THEY EXPERIENCED Intervention: REVIEW PLEASE SKILLS (TREAT PHYSICAL ILLNESS, BALANCE EATING, AVOID MOOD-ALTERING SUBSTANCES, BALANCE SLEEP AND GET EXERCISE) WITH Davy Pique

## 2022-04-07 NOTE — Progress Notes (Signed)
Virtual Visit via Video Note  I connected with [name] on 04/07/22 at 10:00 AM EDT by a video enabled telemedicine application and verified that I am speaking with the correct person using two identifiers.  Location: Patient: pt's car, stationary Provider: clinical office   I discussed the limitations of evaluation and management by telemedicine and the availability of in person appointments. The patient expressed understanding and agreed to proceed.   I discussed the assessment and treatment plan with the patient. The patient was provided an opportunity to ask questions and all were answered. The patient agreed with the plan and demonstrated an understanding of the instructions.   The patient was advised to call back or seek an in-person evaluation if the symptoms worsen or if the condition fails to improve as anticipated.  I provided 33 minutes of non-face-to-face time during this encounter.   Heron Nay, LCSWA    THERAPIST PROGRESS NOTE  Session Time: 33 minutes  Participation Level: Active  Behavioral Response: CasualAlertEuthymic  Type of Therapy: Individual Therapy  Treatment Goals addressed: depression  ProgressTowards Goals: Progressing  Interventions: Supportive  Summary: [name] is a 46 y.o. female who presents for f/u with this cln. She connects somewhat late and maintains appropriate eye contact throughout the session. She states everything is going well, including her new job. She states financially things are difficult but this will improve once she finishes training, at which point she will be eligible for mandatory overtime. She also reports she will be eligible for a promotion in six months. She states her symptoms have been stable, but that she has gained some weight. She reports she has not taken her medication since April because "I didn't feel like I needed it. I listen to my body. I've been on this journey for over 20 years.  If I feel like I need to take it I will." She reports her symptoms have improved. "I just really feel like it was more situational, so a situation change." She reports continued financial concerns, "but it's not making me as anxious and depressed as before because I have a steady and constant job and I'm not having to worry about having a client or not." She reports she is enjoying her job and that her health insurance, which was supposed to have already taken effect, will be active during the next pay period. She describes how things are going with the man in her life, the wealthy man she met in [place], and reports they talk every day. She reports she does not want to sabotage the relationship by shutting down due to not getting what she wants. She is receptive to feedback and thanks cln.  Suicidal/Homicidal: Nowithout intent/plan  Therapist Response: Cln assessed for current stressors, symptoms, and safety since last session. Cln utilized active listening and validation to assist with processing. Cln informed pt that because she will soon have active health insurance, she will need to schedule with a therapist at a different office, to which pt was agreeable. Cln obtained pt's email address to send therapist referrals for [facility] offices that provide virtual appts. Cln updated tx plan to reflect progress. Cln stated that termination due to pt having insurance benefits (as well as having made progress) is the best reason to terminate. Cln expressed that pt did all of the work and that it was a pleasure working with her.   Plan: Pt will transition to a different therapist, as she no longer meets criteria to receive services  at [facility].  Diagnosis: Adjustment disorder with depressed mood  Collaboration of Care: Other therapy referrals emailed to pt  Patient/Guardian was advised Release of Information must be obtained prior to any record release in order to collaborate their care with an outside provider.  Patient/Guardian was advised if they have not already done so to contact the registration department to sign all necessary forms in order for [place] to release information regarding their care.   Consent: Patient/Guardian gives verbal consent for treatment and assignment of benefits for services provided during this visit. Patient/Guardian expressed understanding and agreed to proceed.   Heron Nay, LCSWA 04/07/2022

## 2022-04-07 NOTE — Progress Notes (Signed)
Pt presents to establish care and for colposcopy. Abnormal PAP 10/2021.

## 2022-04-08 LAB — SURGICAL PATHOLOGY

## 2022-08-08 ENCOUNTER — Other Ambulatory Visit (HOSPITAL_COMMUNITY): Payer: Self-pay | Admitting: Physician Assistant

## 2022-08-08 DIAGNOSIS — F4321 Adjustment disorder with depressed mood: Secondary | ICD-10-CM

## 2023-05-19 ENCOUNTER — Ambulatory Visit: Payer: Self-pay | Admitting: Family Medicine

## 2023-05-19 ENCOUNTER — Encounter: Payer: Self-pay | Admitting: Family Medicine

## 2023-05-19 NOTE — Progress Notes (Signed)
Patient did not keep appointment today. She may call to reschedule.  

## 2023-09-23 ENCOUNTER — Other Ambulatory Visit: Payer: Self-pay | Admitting: Family Medicine

## 2023-09-23 ENCOUNTER — Ambulatory Visit
Admission: RE | Admit: 2023-09-23 | Discharge: 2023-09-23 | Disposition: A | Discharge status: Home / Self Care | Source: Ambulatory Visit | Attending: Family Medicine | Admitting: Family Medicine

## 2023-09-23 DIAGNOSIS — M25512 Pain in left shoulder: Secondary | ICD-10-CM

## 2023-09-23 DIAGNOSIS — M546 Pain in thoracic spine: Secondary | ICD-10-CM

## 2023-11-04 ENCOUNTER — Other Ambulatory Visit (HOSPITAL_COMMUNITY)
Admission: RE | Admit: 2023-11-04 | Discharge: 2023-11-04 | Disposition: A | Discharge status: Home / Self Care | Source: Ambulatory Visit | Attending: Family Medicine | Admitting: Family Medicine

## 2023-11-04 ENCOUNTER — Other Ambulatory Visit: Payer: Self-pay | Admitting: Family Medicine

## 2023-11-04 ENCOUNTER — Other Ambulatory Visit (HOSPITAL_COMMUNITY): Admission: RE | Admit: 2023-11-04 | Source: Ambulatory Visit

## 2023-11-04 ENCOUNTER — Other Ambulatory Visit: Payer: Self-pay | Admitting: Obstetrics and Gynecology

## 2023-11-04 DIAGNOSIS — Z124 Encounter for screening for malignant neoplasm of cervix: Secondary | ICD-10-CM | POA: Insufficient documentation

## 2023-11-06 LAB — CYTOLOGY - PAP: Diagnosis: NEGATIVE

## 2023-11-09 LAB — DERMATOLOGY PATHOLOGY
